# Patient Record
Sex: Male | Born: 1960 | Race: Black or African American | Hispanic: No | Marital: Single | Smoking: Never smoker
Health system: Southern US, Community
[De-identification: ages and names within clinical notes are randomized; demographics above are authoritative.]

## PROBLEM LIST (undated history)

## (undated) HISTORY — PX: KNEE ARTHROSCOPY: SUR90

---

## 2003-01-28 ENCOUNTER — Ambulatory Visit (HOSPITAL_COMMUNITY): Admission: RE | Admit: 2003-01-28 | Discharge: 2003-01-28 | Payer: Self-pay | Admitting: Family Medicine

## 2005-07-03 ENCOUNTER — Encounter: Admission: RE | Admit: 2005-07-03 | Discharge: 2005-07-03 | Payer: Self-pay | Admitting: Family Medicine

## 2010-10-06 ENCOUNTER — Encounter
Admission: RE | Admit: 2010-10-06 | Discharge: 2010-10-06 | Payer: Self-pay | Source: Home / Self Care | Attending: Internal Medicine | Admitting: Internal Medicine

## 2015-05-12 ENCOUNTER — Other Ambulatory Visit (HOSPITAL_COMMUNITY): Payer: Self-pay

## 2015-05-13 ENCOUNTER — Other Ambulatory Visit: Payer: Self-pay

## 2015-05-13 ENCOUNTER — Other Ambulatory Visit (HOSPITAL_COMMUNITY): Payer: Self-pay | Admitting: Internal Medicine

## 2015-05-13 ENCOUNTER — Ambulatory Visit (HOSPITAL_COMMUNITY): Payer: 59 | Attending: Internal Medicine

## 2015-05-13 DIAGNOSIS — I10 Essential (primary) hypertension: Secondary | ICD-10-CM | POA: Diagnosis not present

## 2015-05-13 DIAGNOSIS — I313 Pericardial effusion (noninflammatory): Secondary | ICD-10-CM | POA: Diagnosis not present

## 2015-11-12 MED FILL — AMLODIPINE BESYLATE 10 MG T: 10 | 90 days supply | Qty: 90 | Fill #0

## 2015-12-20 ENCOUNTER — Ambulatory Visit (INDEPENDENT_AMBULATORY_CARE_PROVIDER_SITE_OTHER): Payer: 59 | Admitting: Podiatry

## 2015-12-20 ENCOUNTER — Ambulatory Visit (INDEPENDENT_AMBULATORY_CARE_PROVIDER_SITE_OTHER): Payer: 59

## 2015-12-20 ENCOUNTER — Ambulatory Visit: Payer: Self-pay

## 2015-12-20 VITALS — BP 159/99 | HR 74 | Resp 16

## 2015-12-20 DIAGNOSIS — M205X9 Other deformities of toe(s) (acquired), unspecified foot: Secondary | ICD-10-CM

## 2015-12-20 DIAGNOSIS — M79671 Pain in right foot: Secondary | ICD-10-CM

## 2015-12-20 DIAGNOSIS — M722 Plantar fascial fibromatosis: Secondary | ICD-10-CM | POA: Diagnosis not present

## 2015-12-20 MED ORDER — DICLOFENAC SODIUM 75 MG PO TBEC: 75.0000 mg | DELAYED_RELEASE_TABLET | Freq: Two times a day (BID) | ORAL | Status: AC

## 2015-12-20 MED ORDER — TRIAMCINOLONE ACETONIDE 10 MG/ML IJ SUSP
10.0000 mg | Freq: Once | INTRAMUSCULAR | Status: AC
  Administered 2015-12-20: 10 mg

## 2015-12-20 MED FILL — DICLOFENAC SOD EC 75 MG TAB: 75 | 25 days supply | Qty: 50 | Fill #0

## 2015-12-20 NOTE — Progress Notes (Signed)
Subjective:     Patient ID: Jeremy Estrada, male   DOB: [birth year], 55 y.o.   MRN: [id number]  HPI patient states that he's had a lot of pain in his right heel for the last 6-8 months. Also does have lack of motion of his big toe joint left over right   Review of Systems  All other systems reviewed and are negative.      Objective:   Physical Exam  Constitutional: He is oriented to person, place, and time.  Cardiovascular: Intact distal pulses.   Musculoskeletal: Normal range of motion.  Neurological: He is oriented to person, place, and time.  Skin: Skin is warm.  Nursing note and vitals reviewed.  neurovascular status found to be intact with muscle strength adequate range of motion of the subtalar midtarsal joint within normal limits. Patient's found to have inflammatory changes in the plantar right heel with fluid buildup around the medial calcaneal fascial insertion with moderate depression of the arch noted bilateral. The left first MPJ has significant range of motion loss with mild on the right was structural deformity and bone spur formation and patient was noted to have good digital perfusion and is well oriented 3     Assessment:     Acute plantar fasciitis right with structural changes of the feet and also hallux limitus deformity left over right    Plan:     H&P and both conditions reviewed and x-rays reviewed with patient. I'm focusing on the right heel and I injected the plantar fascia 3 mg Kenalog 5 mg Xylocaine and applied fascial brace with instructions. Placed on diclofenac 75 mg twice a day and instructed on physical therapy and discussed hallux limitus with patient  Reports indicated spur formation plantar right heel and significant narrowing and range of motion loss was spur formation left over right first MPJ

## 2015-12-20 NOTE — Progress Notes (Signed)
   Subjective:    Patient ID: Jeremy Estrada, male    DOB: 16-Mar-1961, 55 y.o.   MRN: [id number]  HPI  Pt presents with right heel pain lasting several months now, he c/o burning, sharp shooting sensation, worse after prolonged standing  Review of Systems  All other systems reviewed and are negative.      Objective:   Physical Exam        Assessment & Plan:

## 2015-12-20 NOTE — Patient Instructions (Signed)

## 2015-12-31 MED FILL — LOSARTAN-HCTZ 100-25 MG TAB: 100-25 | 90 days supply | Qty: 90 | Fill #2

## 2016-01-03 ENCOUNTER — Ambulatory Visit: Payer: 59 | Admitting: Podiatry

## 2016-01-06 ENCOUNTER — Ambulatory Visit: Payer: 59 | Admitting: Podiatry

## 2016-02-22 MED FILL — AMLODIPINE BESYLATE 10 MG T: 10 | 30 days supply | Qty: 30 | Fill #0

## 2016-03-06 MED FILL — DICLOFENAC SOD EC 75 MG TAB: 75 | 25 days supply | Qty: 50 | Fill #1

## 2016-03-29 ENCOUNTER — Encounter: Payer: Self-pay | Admitting: Podiatry

## 2016-03-29 ENCOUNTER — Ambulatory Visit (INDEPENDENT_AMBULATORY_CARE_PROVIDER_SITE_OTHER): Payer: 59 | Admitting: Podiatry

## 2016-03-29 DIAGNOSIS — M722 Plantar fascial fibromatosis: Secondary | ICD-10-CM | POA: Diagnosis not present

## 2016-03-29 MED ORDER — TRIAMCINOLONE ACETONIDE 10 MG/ML IJ SUSP
10.0000 mg | Freq: Once | INTRAMUSCULAR | Status: AC
  Administered 2016-03-29: 10 mg

## 2016-03-29 MED FILL — AMLODIPINE BESYLATE 10 MG T: 10 | 30 days supply | Qty: 30 | Fill #1

## 2016-03-29 MED FILL — LOSARTAN-HCTZ 100-25 MG TAB: 100-25 | 90 days supply | Qty: 90 | Fill #3

## 2016-03-30 NOTE — Progress Notes (Signed)
Subjective:     Patient ID: Jeremy Estrada, male   DOB: 1960/11/09, 55 y.o.   MRN: [id number]  HPI patient states I'm starting to get quite a bit of pain in my heel again and it did improve for a little period of time but now is sore   Review of Systems     Objective:   Physical Exam Neurovascular status intact muscle strength adequate with exquisite discomfort plantar aspect right heel at the insertional point tendon into the calcaneus at this time    Assessment:     Plantar fasciitis right with inflammation and fluid around the medial band still noted    Plan:     Advised on anti-inflammatories physical therapy and I reinjected the plantar fascia 3 mg Kenalog 5 mg Xylocaine. I then went ahead and scanned for custom orthotics to reduce stress on the heel

## 2016-04-28 MED FILL — AMLODIPINE BESYLATE 10 MG T: 10 | 90 days supply | Qty: 90 | Fill #0

## 2016-05-31 MED FILL — DICLOFENAC SOD EC 75 MG TAB: 75 | 25 days supply | Qty: 50 | Fill #2

## 2016-06-21 ENCOUNTER — Ambulatory Visit: Payer: 59 | Admitting: *Deleted

## 2016-06-21 DIAGNOSIS — M722 Plantar fascial fibromatosis: Secondary | ICD-10-CM

## 2016-06-21 NOTE — Progress Notes (Signed)
Patient ID: Jeremy Estrada, male   DOB: 23-Jan-1961, 55 y.o.   MRN: [id number]  Patient presents for orthotic pick up.  Verbal and written break in and wear instructions given.  Patient will follow up in 4 weeks if symptoms worsen or fail to improve.

## 2016-06-21 NOTE — Patient Instructions (Signed)

## 2016-07-27 ENCOUNTER — Other Ambulatory Visit: Payer: Self-pay | Admitting: Podiatry

## 2016-07-27 MED FILL — AMLODIPINE BESYLATE 10 MG T: 10 | 90 days supply | Qty: 90 | Fill #1

## 2016-07-28 MED FILL — LOSARTAN-HCTZ 100-25 MG TAB: 100-25 | 90 days supply | Qty: 90 | Fill #0

## 2016-08-09 DIAGNOSIS — Z6832 Body mass index (BMI) 32.0-32.9, adult: Secondary | ICD-10-CM | POA: Diagnosis not present

## 2016-08-09 DIAGNOSIS — Z0000 Encounter for general adult medical examination without abnormal findings: Secondary | ICD-10-CM | POA: Diagnosis not present

## 2016-08-09 DIAGNOSIS — I10 Essential (primary) hypertension: Secondary | ICD-10-CM | POA: Diagnosis not present

## 2016-08-09 DIAGNOSIS — E663 Overweight: Secondary | ICD-10-CM | POA: Diagnosis not present

## 2016-08-09 DIAGNOSIS — Z125 Encounter for screening for malignant neoplasm of prostate: Secondary | ICD-10-CM | POA: Diagnosis not present

## 2016-09-29 DIAGNOSIS — R350 Frequency of micturition: Secondary | ICD-10-CM | POA: Diagnosis not present

## 2016-10-02 ENCOUNTER — Ambulatory Visit
Admission: RE | Admit: 2016-10-02 | Discharge: 2016-10-02 | Disposition: A | Discharge status: Home / Self Care | Payer: 59 | Source: Ambulatory Visit | Attending: Internal Medicine | Admitting: Internal Medicine

## 2016-10-02 ENCOUNTER — Other Ambulatory Visit: Payer: Self-pay | Admitting: Internal Medicine

## 2016-10-02 DIAGNOSIS — R109 Unspecified abdominal pain: Secondary | ICD-10-CM

## 2016-10-02 DIAGNOSIS — R319 Hematuria, unspecified: Secondary | ICD-10-CM | POA: Diagnosis not present

## 2016-10-05 MED FILL — TAMSULOSIN HCL 0.4 MG CAP: 0.4 | 90 days supply | Qty: 90 | Fill #0

## 2016-10-26 ENCOUNTER — Other Ambulatory Visit: Payer: Self-pay | Admitting: Gastroenterology

## 2016-11-03 ENCOUNTER — Other Ambulatory Visit: Payer: Self-pay | Admitting: Gastroenterology

## 2016-11-20 MED FILL — LOSARTAN-HCTZ 100-25 MG TAB: 100-25 | 90 days supply | Qty: 90 | Fill #0

## 2016-11-20 MED FILL — AMLODIPINE BESYLATE 10 MG T: 10 | 90 days supply | Qty: 90 | Fill #2

## 2016-11-22 MED FILL — GAVILYTE-N SOLUTION: 420 | 1 days supply | Qty: 4000 | Fill #0

## 2016-12-19 ENCOUNTER — Ambulatory Visit (HOSPITAL_COMMUNITY): Payer: 59 | Admitting: Anesthesiology

## 2016-12-19 ENCOUNTER — Encounter (HOSPITAL_COMMUNITY)
Admission: RE | Disposition: A | Discharge status: Home / Self Care | Payer: Self-pay | Source: Ambulatory Visit | Attending: Gastroenterology

## 2016-12-19 ENCOUNTER — Encounter (HOSPITAL_COMMUNITY): Payer: Self-pay | Admitting: Anesthesiology

## 2016-12-19 ENCOUNTER — Ambulatory Visit (HOSPITAL_COMMUNITY)
Admission: RE | Admit: 2016-12-19 | Discharge: 2016-12-19 | Disposition: A | Discharge status: Home / Self Care | Payer: 59 | Source: Ambulatory Visit | Attending: Gastroenterology | Admitting: Gastroenterology

## 2016-12-19 DIAGNOSIS — K621 Rectal polyp: Secondary | ICD-10-CM | POA: Diagnosis not present

## 2016-12-19 DIAGNOSIS — I10 Essential (primary) hypertension: Secondary | ICD-10-CM | POA: Insufficient documentation

## 2016-12-19 DIAGNOSIS — Z1211 Encounter for screening for malignant neoplasm of colon: Secondary | ICD-10-CM | POA: Diagnosis not present

## 2016-12-19 DIAGNOSIS — D122 Benign neoplasm of ascending colon: Secondary | ICD-10-CM | POA: Insufficient documentation

## 2016-12-19 DIAGNOSIS — D128 Benign neoplasm of rectum: Secondary | ICD-10-CM | POA: Insufficient documentation

## 2016-12-19 HISTORY — PX: COLONOSCOPY WITH PROPOFOL: SHX5780

## 2016-12-19 SURGERY — COLONOSCOPY WITH PROPOFOL
Anesthesia: Monitor Anesthesia Care

## 2016-12-19 MED ORDER — PROPOFOL 10 MG/ML IV BOLUS
INTRAVENOUS | Status: AC
  Filled 2016-12-19: qty 40

## 2016-12-19 MED ORDER — PROPOFOL 500 MG/50ML IV EMUL
INTRAVENOUS | Status: DC | PRN
  Administered 2016-12-19: 125 ug/kg/min via INTRAVENOUS

## 2016-12-19 MED ORDER — SODIUM CHLORIDE 0.9 % IV SOLN: INTRAVENOUS | Status: DC

## 2016-12-19 MED ORDER — PROPOFOL 500 MG/50ML IV EMUL
INTRAVENOUS | Status: DC | PRN
  Administered 2016-12-19: 50 mg via INTRAVENOUS
  Administered 2016-12-19: 30 mg via INTRAVENOUS
  Administered 2016-12-19: 20 mg via INTRAVENOUS

## 2016-12-19 MED ORDER — LACTATED RINGERS IV SOLN: INTRAVENOUS | Status: DC

## 2016-12-19 MED ORDER — LACTATED RINGERS IV SOLN
INTRAVENOUS | Status: DC | PRN
  Administered 2016-12-19: 11:00:00 via INTRAVENOUS

## 2016-12-19 MED ORDER — MIDAZOLAM HCL 2 MG/2ML IJ SOLN
INTRAMUSCULAR | Status: AC
  Filled 2016-12-19: qty 2

## 2016-12-19 MED ORDER — MIDAZOLAM HCL 5 MG/5ML IJ SOLN
INTRAMUSCULAR | Status: DC | PRN
  Administered 2016-12-19: 2 mg via INTRAVENOUS

## 2016-12-19 SURGICAL SUPPLY — 21 items

## 2016-12-19 NOTE — Transfer of Care (Signed)
Immediate Anesthesia Transfer of Care Note  Patient: Jeremy Estrada  Procedure(s) Performed: Procedure(s): COLONOSCOPY WITH PROPOFOL (N/A)  Patient Location: PACU  Anesthesia Type:MAC  Level of Consciousness:  sedated, patient cooperative and responds to stimulation  Airway & Oxygen Therapy:Patient Spontanous Breathing and Patient connected to face mask oxgen  Post-op Assessment:  Report given to PACU RN and Post -op Vital signs reviewed and stable  Post vital signs:  Reviewed and stable  Last Vitals:  Vitals:   12/19/16 1138 12/19/16 1222  BP: (!) 151/90 123/66  Pulse: 83 74  Resp: (!) 21 14  Temp: 36.7 C 95.2 C    Complications: No apparent anesthesia complications

## 2016-12-19 NOTE — Anesthesia Postprocedure Evaluation (Signed)
Anesthesia Post Note  Patient: Jeremy Estrada  Procedure(s) Performed: Procedure(s) (LRB): COLONOSCOPY WITH PROPOFOL (N/A)  Patient location during evaluation: PACU Anesthesia Type: MAC Level of consciousness: awake and alert Pain management: pain level controlled Vital Signs Assessment: post-procedure vital signs reviewed and stable Respiratory status: spontaneous breathing Cardiovascular status: stable Anesthetic complications: no       Last Vitals:  Vitals:   12/19/16 1240 12/19/16 1248  BP: (!) 134/91   Pulse: 67 63  Resp: 18 17  Temp:      Last Pain:  Vitals:   12/19/16 1222  TempSrc: Oral                 Nolon Nations

## 2016-12-19 NOTE — Anesthesia Preprocedure Evaluation (Signed)
Anesthesia Evaluation  Patient identified by MRN, date of birth, ID band Patient awake    Reviewed: Allergy & Precautions, NPO status , Patient's Chart, lab work & pertinent test results  Airway Mallampati: II  TM Distance: >3 FB Neck ROM: Full    Dental no notable dental hx.    Pulmonary neg pulmonary ROS,    Pulmonary exam normal breath sounds clear to auscultation       Cardiovascular negative cardio ROS Normal cardiovascular exam Rhythm:Regular Rate:Normal     Neuro/Psych negative neurological ROS  negative psych ROS   GI/Hepatic negative GI ROS, Neg liver ROS,   Endo/Other  negative endocrine ROS  Renal/GU negative Renal ROS     Musculoskeletal negative musculoskeletal ROS (+)   Abdominal   Peds  Hematology negative hematology ROS (+)   Anesthesia Other Findings   Reproductive/Obstetrics negative OB ROS                             Anesthesia Physical Anesthesia Plan  ASA: II  Anesthesia Plan: MAC   Post-op Pain Management:    Induction: Intravenous  Airway Management Planned:   Additional Equipment:   Intra-op Plan:   Post-operative Plan:   Informed Consent: I have reviewed the patients History and Physical, chart, labs and discussed the procedure including the risks, benefits and alternatives for the proposed anesthesia with the patient or authorized representative who has indicated his/her understanding and acceptance.   Dental advisory given  Plan Discussed with: CRNA  Anesthesia Plan Comments:         Anesthesia Quick Evaluation  

## 2016-12-19 NOTE — Op Note (Signed)
[facility] Patient Name: Jeremy Estrada Procedure Date: 12/19/2016 MRN: [id number] Attending MD: Garlan Fair , MD Date of Birth: 22-Oct-1961 CSN: [id number] Age: 56 Admit Type: Outpatient Procedure:                Colonoscopy Indications:              Screening for colorectal malignant neoplasm Providers:                Garlan Fair, MD, Dustin Flock RN, RN, Cherylynn Ridges, Technician, Arnoldo Hooker, CRNA Referring MD:              Medicines:                Propofol per Anesthesia Complications:            No immediate complications. Estimated Blood Loss:     Estimated blood loss was minimal. Procedure:                Pre-Anesthesia Assessment:                           - Prior to the procedure, a History and Physical                            was performed, and patient medications and                            allergies were reviewed. The patient's tolerance of                            previous anesthesia was also reviewed. The risks                            and benefits of the procedure and the sedation                            options and risks were discussed with the patient.                            All questions were answered, and informed consent                            was obtained. Prior Anticoagulants: The patient has                            taken aspirin, last dose was 5 days prior to                            procedure. ASA Grade Assessment: II - A patient                            with mild systemic disease. After reviewing the  risks and benefits, the patient was deemed in                            satisfactory condition to undergo the procedure.                           After obtaining informed consent, the colonoscope                            was passed under direct vision. Throughout the                            procedure, the patient's blood pressure, pulse, and                oxygen saturations were monitored continuously. The                            [id number] scope was introduced through                            the anus and advanced to the the cecum, identified                            by appendiceal orifice and ileocecal valve. The                            colonoscopy was performed without difficulty. The                            patient tolerated the procedure well. The quality                            of the bowel preparation was good. The appendiceal                            orifice and the rectum were photographed. Scope In: 11:58:29 AM Scope Out: 12:15:27 PM Scope Withdrawal Time: 0 hours 13 minutes 18 seconds  Total Procedure Duration: 0 hours 16 minutes 58 seconds  Findings:      The perianal and digital rectal examinations were normal.      The entire examined colon appeared normal except for the removal of a 2       mm sessile ascending colon polyp with the cold biopsy forceps and the       removal of a 2 mm sessile rectal polyp with the cold biopsy forceps. . Impression:               - The entire examined colon is normal except for                            the removal of two diminuitive colon polyps.. Moderate Sedation:      N/A- Per Anesthesia Care Recommendation:           - Patient has a contact number available for  emergencies. The signs and symptoms of potential                            delayed complications were discussed with the                            patient. Return to normal activities tomorrow.                            Written discharge instructions were provided to the                            patient.                           - Repeat colonoscopy date to be determined after                            pending pathology results are reviewed for                            screening purposes.                           - Resume previous diet.                            - Continue present medications. Procedure Code(s):        --- Professional ---                           [id number], Colorectal cancer screening; colonoscopy on                            individual not meeting criteria for high risk Diagnosis Code(s):        --- Professional ---                           Z12.11, Encounter for screening for malignant                            neoplasm of colon CPT copyright 2016 [employer]. All rights reserved. The codes documented in this report are preliminary and upon coder review may  be revised to meet current compliance requirements. Earle Gell, MD Garlan Fair, MD 12/19/2016 12:24:29 PM This report has been signed electronically. Number of Addenda: 0

## 2016-12-19 NOTE — Discharge Instructions (Signed)

## 2016-12-19 NOTE — H&P (Signed)
Procedure: Baseline screening colonoscopy  History: The patient is a 56 year old male born 08/16/1961. He is scheduled to undergo a screening colonoscopy today.  Medication allergies: All TACE cause cough  Past medical history: Left knee arthroscopic surgery. Hypertension.  Family history: No family history of colon cancer. Brother diagnosed with prostate cancer.  Exam: The patient is alert and lying comfortably on the endoscopy stretcher. Abdomen is soft and nontender to palpation. Lungs are clear to auscultation. Cardiac exam reveals a regular rhythm.  Plan: Proceed with screening colonoscopy

## 2016-12-20 ENCOUNTER — Encounter (HOSPITAL_COMMUNITY): Payer: Self-pay | Admitting: Gastroenterology

## 2017-01-31 MED FILL — TAMSULOSIN HCL 0.4 MG CAP: 0.4 | 90 days supply | Qty: 90 | Fill #1

## 2017-01-31 MED FILL — LOSARTAN-HCTZ 100-25 MG TAB: 100-25 | 90 days supply | Qty: 90 | Fill #1

## 2017-01-31 MED FILL — AMLODIPINE BESYLATE 10 MG T: 10 | 90 days supply | Qty: 90 | Fill #3

## 2017-03-05 IMAGING — CT CT ABD-PELV W/O CM
2 of 4 series · 13 of 36 positions shown, 18 images · non-contrast
Comparison: None.

CLINICAL DATA: Hematuria. Bilateral flank pain for 3 months.
Hypertension. Diabetes.

EXAM:
CT ABDOMEN AND PELVIS WITHOUT CONTRAST
TECHNIQUE: Multidetector CT imaging of the abdomen and pelvis was performed
following the standard protocol without IV contrast.

[Series 601: coronal body · coronal · 1.03mm/px · 1 of 138 slices shown, 2 images]
[im 46/138  soft-tissue]
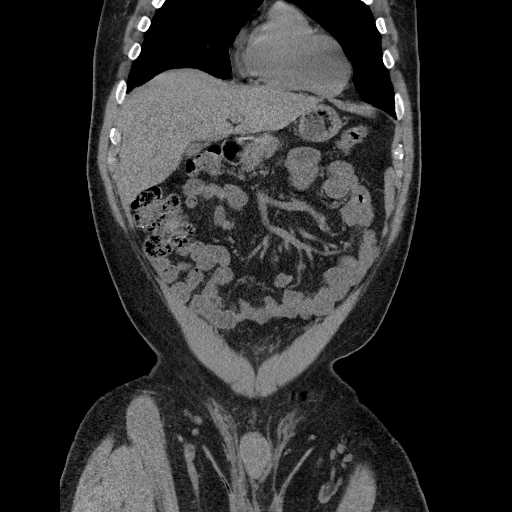
[im 46/138  bone]
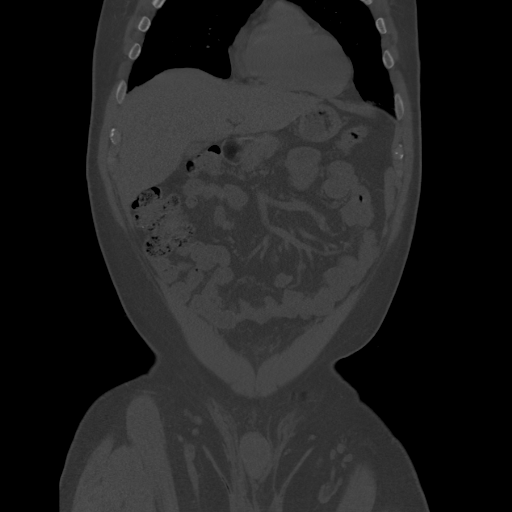

[Series 602: sagittal body · sagittal · 1.03mm/px · 12 of 171 slices shown, 16 images]
[im 11/171  soft-tissue]
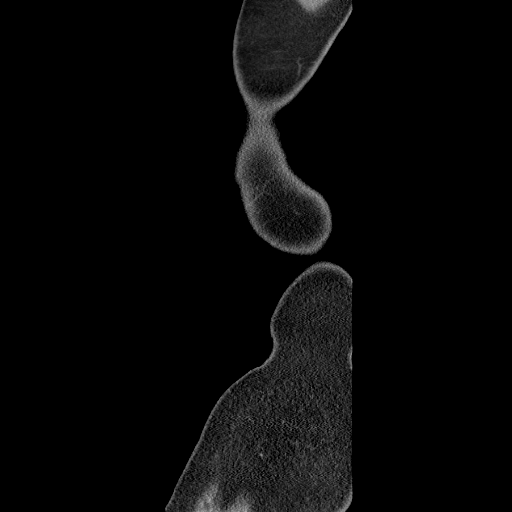
[im 11/171  lung]
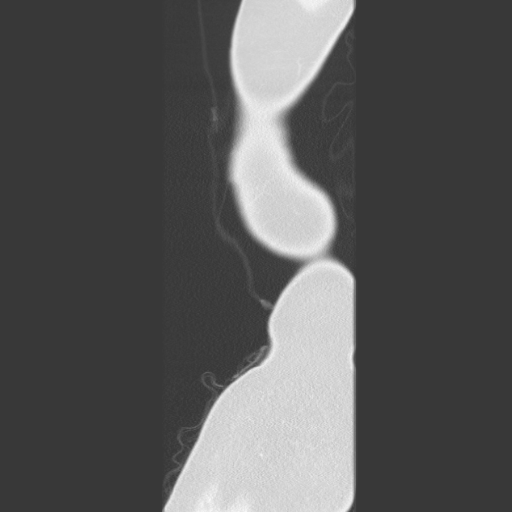
[im 11/171  bone]
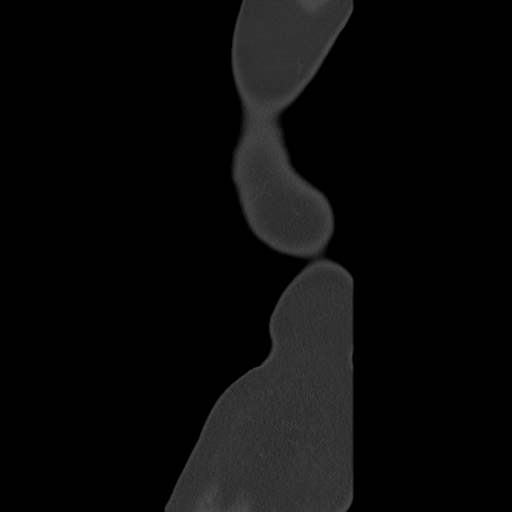
[im 21/171  lung]
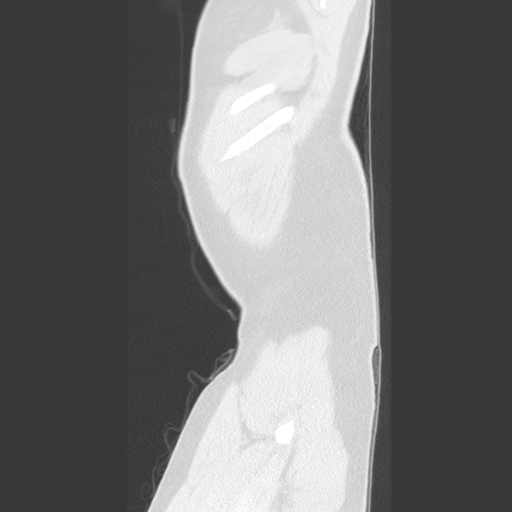
[im 31/171  soft-tissue]
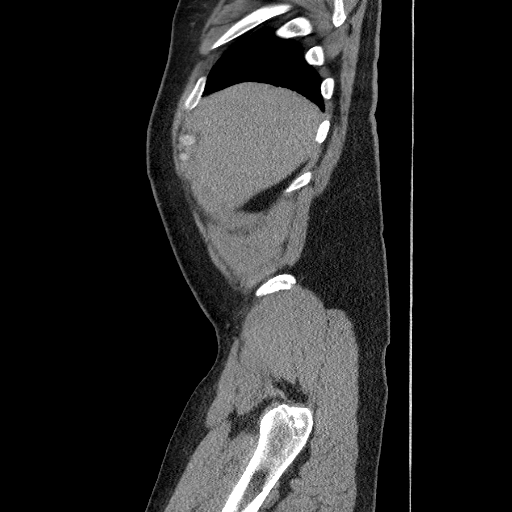
[im 31/171  lung]
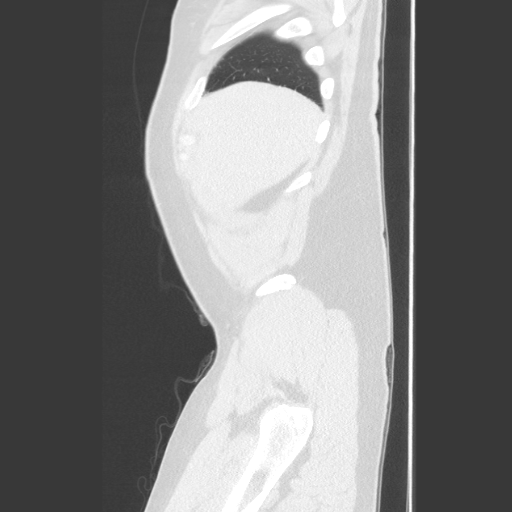
[im 41/171  lung]
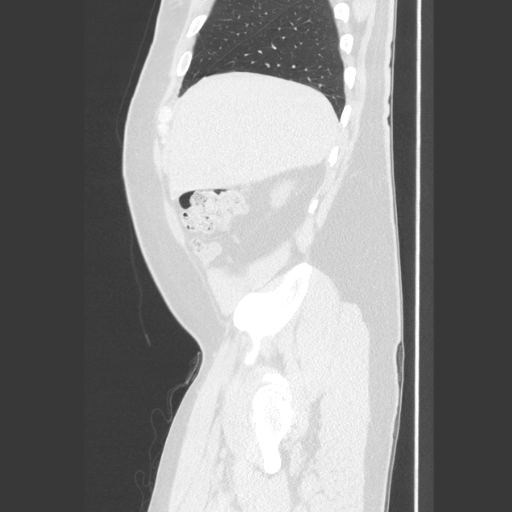
[im 51/171  soft-tissue]
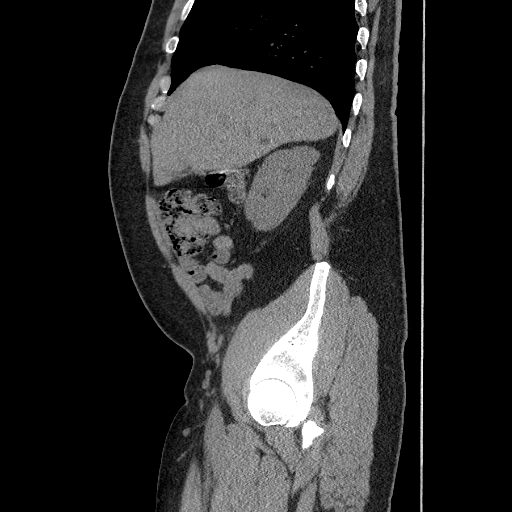
[im 61/171  soft-tissue]
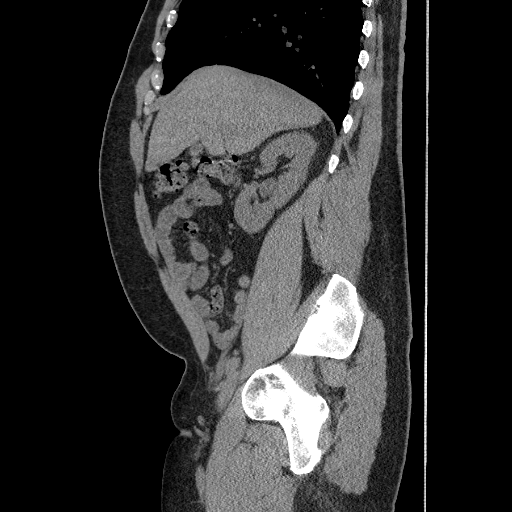
[im 81/171  soft-tissue]
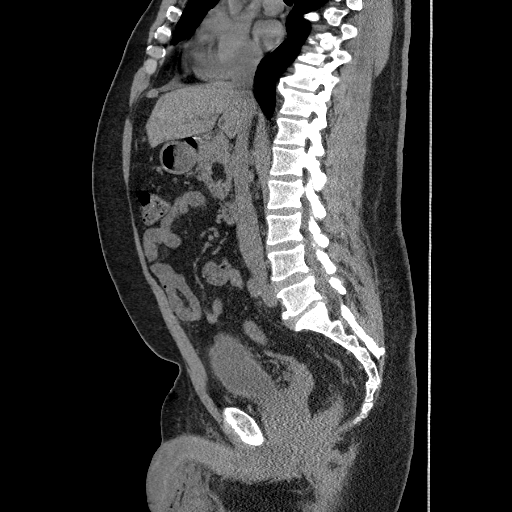
[im 91/171  soft-tissue]
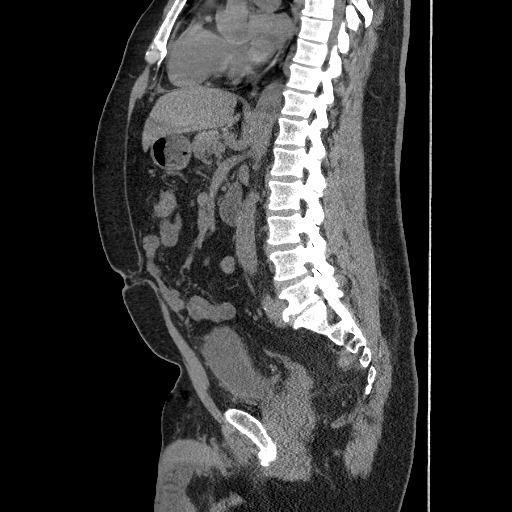
[im 111/171  soft-tissue]
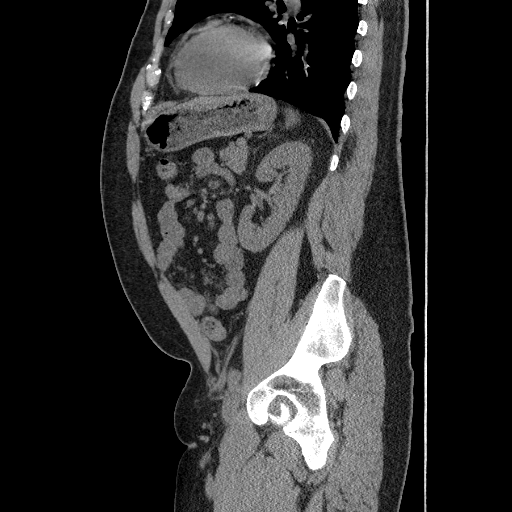
[im 131/171  soft-tissue]
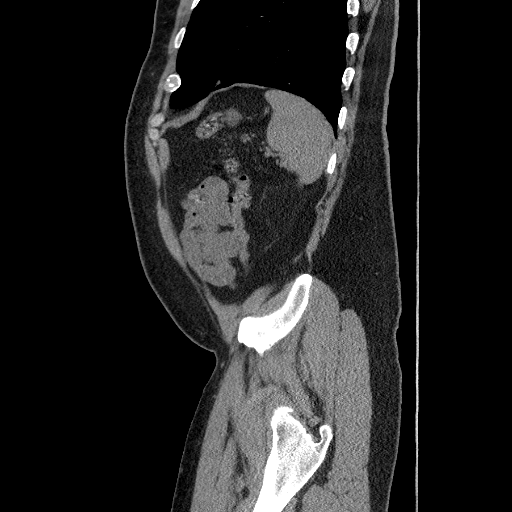
[im 141/171  soft-tissue]
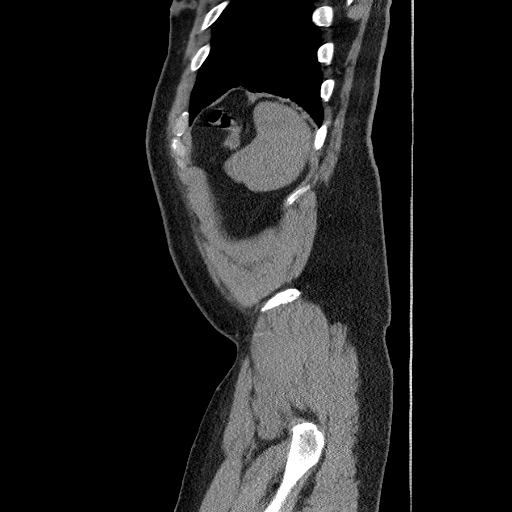
[im 141/171  bone]
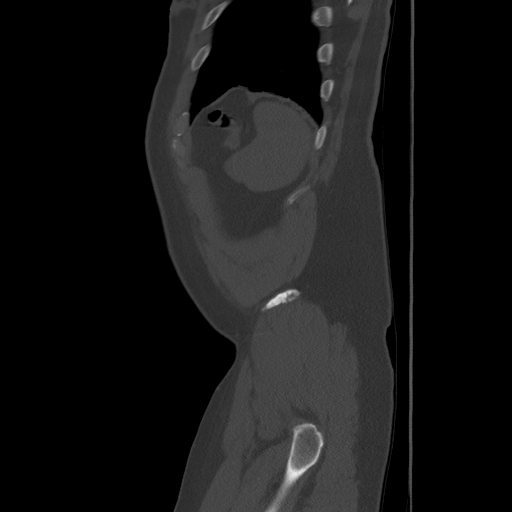
[im 161/171  soft-tissue]
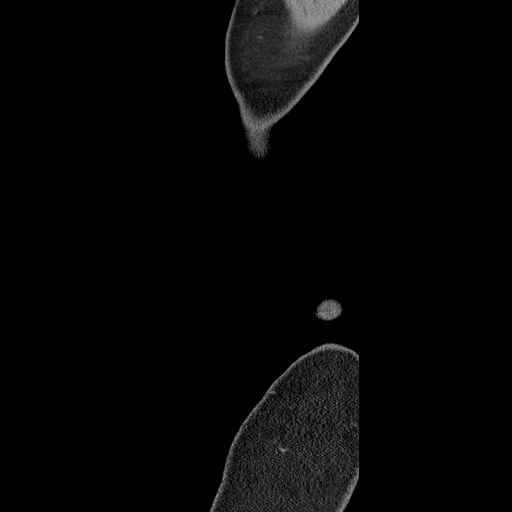

[13 of 36 positions shown; findings below may reference images not displayed]

FINDINGS: Lower chest: 5 mm lingular nodule on image 18/series 4. Mild
scarring or subsegmental atelectasis in the left lower lobe.

Normal heart size without pericardial or pleural effusion. Lad
coronary artery atherosclerosis on image 4/series 3. The

Hepatobiliary: Well-circumscribed hypoattenuating liver lesions are
likely cysts. The largest is in segment 4A and measures 2.2 cm.
Normal gallbladder, without biliary ductal dilatation.

Pancreas: Pancreatic head/ uncinate process lipoma versus atrophy
with interdigitation of peripancreatic fat. Example 2.2 cm on image
36/series 3.

Spleen: Normal in size, without focal abnormality.

Adrenals/Urinary Tract: Normal adrenal glands. No renal calculi or
hydronephrosis. No hydroureter or ureteric calculi. No bladder
calculi. Mild pericystic interstitial thickening suspected,
including on image 76/series 3.

Stomach/Bowel: Normal stomach, without wall thickening. Normal
colon, appendix, and terminal ileum. Normal small bowel.

Vascular/Lymphatic: Aortic and branch vessel atherosclerosis. No
abdominopelvic adenopathy.

Reproductive: Moderate prostatomegaly.

Other: No significant free fluid. Periumbilical fat containing
hernia or laxity on image 58/series 3.

Musculoskeletal: Left iliac sclerotic lesion is most likely a bone
island. Degenerate disc disease involves the lumbosacral junction.
IMPRESSION: 1.  No urinary tract calculi or hydronephrosis.
2. Mild pericystic interstitial thickening. Concurrent
prostatomegaly. Findings could relate to a component of bladder
outlet obstruction or cystitis.
3.  Coronary artery atherosclerosis. Aortic atherosclerosis.
4. 5 mm lingular nodule. No follow-up needed if patient is low-risk.
Non-contrast chest CT can be considered in 12 months if patient is
high-risk. This recommendation follows the consensus statement:
Guidelines for Management of Incidental Pulmonary Nodules Detected

## 2017-07-04 MED FILL — LOSARTAN-HCTZ 100-25 MG TAB: 100-25 | 90 days supply | Qty: 90 | Fill #2

## 2017-07-04 MED FILL — AMLODIPINE BESYLATE 10 MG T: 10 | 90 days supply | Qty: 90 | Fill #0

## 2017-07-04 MED FILL — TAMSULOSIN HCL 0.4 MG CAP: 0.4 | 90 days supply | Qty: 90 | Fill #2

## 2017-10-22 MED FILL — LOSARTAN-HCTZ 100-25 MG TAB: 100-25 | 90 days supply | Qty: 90 | Fill #0

## 2017-10-22 MED FILL — TAMSULOSIN HCL 0.4 MG CAP: 0.4 | 90 days supply | Qty: 90 | Fill #0

## 2017-10-22 MED FILL — AMLODIPINE BESYLATE 10 MG T: 10 | 90 days supply | Qty: 90 | Fill #0

## 2018-02-08 MED FILL — AMLODIPINE BESYLATE 10 MG T: 10 | 90 days supply | Qty: 90 | Fill #1

## 2018-02-08 MED FILL — LOSARTAN-HCTZ 100-25 MG TAB: 100-25 | 90 days supply | Qty: 90 | Fill #1

## 2018-05-31 MED FILL — AMLODIPINE BESYLATE 10 MG T: 10 | 90 days supply | Qty: 90 | Fill #2

## 2018-05-31 MED FILL — LOSARTAN-HCTZ 100-25 MG TAB: 100-25 | 90 days supply | Qty: 90 | Fill #2

## 2018-09-17 MED FILL — AMLODIPINE BESYLATE 10 MG T: 10 | 90 days supply | Qty: 90 | Fill #3

## 2018-09-24 MED FILL — LOSARTAN POTASSIUM 100 MG T: 100 | 90 days supply | Qty: 90 | Fill #0

## 2018-09-24 MED FILL — HYDROCHLOROTHIAZIDE 25 MG T: 25 | 90 days supply | Qty: 90 | Fill #0

## 2019-01-10 MED FILL — HYDROCHLOROTHIAZIDE 25 MG T: 25 | 90 days supply | Qty: 90 | Fill #1

## 2019-01-10 MED FILL — LOSARTAN POTASSIUM 100 MG T: 100 | 90 days supply | Qty: 90 | Fill #1

## 2019-01-15 MED FILL — MELOXICAM 15 MG TABLET: 15 | 30 days supply | Qty: 30 | Fill #0

## 2019-01-15 MED FILL — AMLODIPINE BESYLATE 10 MG T: 10 | 90 days supply | Qty: 90 | Fill #0

## 2019-03-29 ENCOUNTER — Other Ambulatory Visit: Payer: Self-pay

## 2019-03-29 DIAGNOSIS — Z20822 Contact with and (suspected) exposure to covid-19: Secondary | ICD-10-CM

## 2019-03-31 LAB — NOVEL CORONAVIRUS, NAA: SARS-CoV-2, NAA: NOT DETECTED

## 2019-04-08 ENCOUNTER — Ambulatory Visit: Payer: Self-pay

## 2019-04-08 NOTE — Telephone Encounter (Signed)
This encounter was created in error - please disregard.

## 2019-04-08 NOTE — Telephone Encounter (Addendum)
Patient called about the below symptoms he mentioned to the agent. He says that he was calling for his covid results and since they are negative, he just asked the question why he was still having those symptoms. I asked did he contact his PCP, he says no, because I was waiting on my covid results and now that they are negative, I just wonder why am I still having the symptoms. I advised him to call his PCP and discuss the symptoms, he verbalized understanding and says he will call.   Pt reports off and on chest tightness  Feels like he has had a cold for 2 months  2 nights ago reports that he felt "bubbling/movement" in his chest  Dry cough (BP medicine possible cause)  Slight headache  Symptoms

## 2019-04-17 MED FILL — AMLODIPINE BESYLATE 10 MG T: 10 | 90 days supply | Qty: 90 | Fill #0

## 2019-04-17 MED FILL — HYDROCHLOROTHIAZIDE 25 MG T: 25 | 90 days supply | Qty: 90 | Fill #2

## 2019-04-17 MED FILL — LOSARTAN POTASSIUM 100 MG T: 100 | 90 days supply | Qty: 90 | Fill #2

## 2019-08-04 MED FILL — LOSARTAN POTASSIUM 100 MG T: 100 | 90 days supply | Qty: 90 | Fill #3

## 2019-08-04 MED FILL — HYDROCHLOROTHIAZIDE 25 MG T: 25 | 90 days supply | Qty: 90 | Fill #3

## 2019-08-04 MED FILL — AMLODIPINE BESYLATE 10 MG T: 10 | 90 days supply | Qty: 90 | Fill #1

## 2019-11-07 MED FILL — HYDROCHLOROTHIAZIDE 25 MG T: 25 | 90 days supply | Qty: 90 | Fill #0

## 2019-11-07 MED FILL — LOSARTAN POTASSIUM 100 MG T: 100 | 90 days supply | Qty: 90 | Fill #0

## 2019-11-07 MED FILL — AMLODIPINE BESYLATE 10 MG T: 10 | 90 days supply | Qty: 90 | Fill #2

## 2020-01-05 ENCOUNTER — Ambulatory Visit: Payer: 59 | Attending: Internal Medicine

## 2020-01-05 DIAGNOSIS — Z23 Encounter for immunization: Secondary | ICD-10-CM

## 2020-01-05 NOTE — Progress Notes (Signed)
   Covid-19 Vaccination Clinic  Name:  Raahim Golas    MRN: [id number] DOB: 06-Feb-1961  01/05/2020  Mr. Dobin was observed post Covid-19 immunization for 15 minutes without incident. He was provided with Vaccine Information Sheet and instruction to access the V-Safe system.   Mr. Bruington was instructed to call 911 with any severe reactions post vaccine: Marland Kitchen Difficulty breathing  . Swelling of face and throat  . A fast heartbeat  . A bad rash all over body  . Dizziness and weakness   Immunizations Administered    Name Date Dose VIS Date Route   Pfizer COVID-19 Vaccine 01/05/2020  3:14 PM 0.3 mL 10/03/2019 Intramuscular   Manufacturer: [place]   Lot: [id number]   [place]: [id number]

## 2020-01-28 ENCOUNTER — Ambulatory Visit: Payer: 59 | Attending: Internal Medicine

## 2020-01-28 DIAGNOSIS — Z23 Encounter for immunization: Secondary | ICD-10-CM

## 2020-01-28 NOTE — Progress Notes (Signed)
   Covid-19 Vaccination Clinic  Name:  Jeremy Estrada    MRN: [id number] DOB: [birth year]  01/28/2020  Jeremy Estrada was observed post Covid-19 immunization for 15 minutes without incident. He was provided with Vaccine Information Sheet and instruction to access the V-Safe system.   Jeremy Estrada was instructed to call 911 with any severe reactions post vaccine: Marland Kitchen Difficulty breathing  . Swelling of face and throat  . A fast heartbeat  . A bad rash all over body  . Dizziness and weakness   Immunizations Administered    Name Date Dose VIS Date Route   Pfizer COVID-19 Vaccine 01/28/2020  1:37 PM 0.3 mL 10/03/2019 Intramuscular   Manufacturer: [place]   Lot: [id number]   [place]: [id number]

## 2020-02-10 MED FILL — LOSARTAN POTASSIUM 100 MG T: 100 | 90 days supply | Qty: 90 | Fill #1

## 2020-02-11 ENCOUNTER — Other Ambulatory Visit (HOSPITAL_COMMUNITY): Payer: Self-pay | Admitting: Internal Medicine

## 2020-02-11 MED FILL — AMLODIPINE BESYLATE 10 MG T: 10 | 90 days supply | Qty: 90 | Fill #0

## 2020-05-24 MED FILL — AMLODIPINE BESYLATE 10 MG T: 10 | 90 days supply | Qty: 90 | Fill #1

## 2020-05-24 MED FILL — LOSARTAN POTASSIUM 100 MG T: 100 | 90 days supply | Qty: 90 | Fill #2

## 2020-05-24 MED FILL — HYDROCHLOROTHIAZIDE 25 MG T: 25 | 90 days supply | Qty: 90 | Fill #1

## 2020-09-03 MED FILL — AMLODIPINE BESYLATE 10 MG T: 10 | 90 days supply | Qty: 90 | Fill #2

## 2020-09-03 MED FILL — LOSARTAN POTASSIUM 100 MG T: 100 | 90 days supply | Qty: 90 | Fill #3

## 2020-09-03 MED FILL — HYDROCHLOROTHIAZIDE 25 MG T: 25 | 90 days supply | Qty: 90 | Fill #2

## 2020-12-10 MED FILL — AMLODIPINE BESYLATE 10 MG T: 10 | 90 days supply | Qty: 90 | Fill #3

## 2020-12-15 ENCOUNTER — Other Ambulatory Visit (HOSPITAL_COMMUNITY): Payer: Self-pay | Admitting: Internal Medicine

## 2020-12-15 MED FILL — SILDENAFIL CITRATE 100 MG T: 100 | 30 days supply | Qty: 10 | Fill #0

## 2020-12-15 MED FILL — LOSARTAN POTASSIUM 100 MG T: 100 | 30 days supply | Qty: 30 | Fill #0

## 2020-12-15 MED FILL — HYDROCHLOROTHIAZIDE 25 MG T: 25 | 90 days supply | Qty: 90 | Fill #0

## 2020-12-29 ENCOUNTER — Ambulatory Visit
Admission: RE | Admit: 2020-12-29 | Discharge: 2020-12-29 | Disposition: A | Discharge status: Home / Self Care | Payer: 59 | Source: Ambulatory Visit | Attending: Internal Medicine | Admitting: Internal Medicine

## 2020-12-29 ENCOUNTER — Other Ambulatory Visit: Payer: Self-pay | Admitting: Internal Medicine

## 2020-12-29 DIAGNOSIS — R52 Pain, unspecified: Secondary | ICD-10-CM

## 2021-01-18 MED FILL — LOSARTAN POTASSIUM 100 MG T: 100 | 30 days supply | Qty: 30 | Fill #1

## 2021-02-25 ENCOUNTER — Other Ambulatory Visit (HOSPITAL_COMMUNITY): Payer: Self-pay

## 2021-02-25 MED FILL — Losartan Potassium Tab 100 MG: ORAL | 90 days supply | Qty: 90 | Fill #0 | Status: AC

## 2021-03-25 ENCOUNTER — Other Ambulatory Visit (HOSPITAL_COMMUNITY): Payer: Self-pay

## 2021-03-29 ENCOUNTER — Other Ambulatory Visit (HOSPITAL_COMMUNITY): Payer: Self-pay

## 2021-03-29 MED FILL — Amlodipine Besylate Tab 10 MG (Base Equivalent): ORAL | 90 days supply | Qty: 90 | Fill #0 | Status: AC

## 2021-03-30 ENCOUNTER — Other Ambulatory Visit (HOSPITAL_COMMUNITY): Payer: Self-pay

## 2021-06-01 IMAGING — CR DG SHOULDER 2+V*R*
3 series · 3 of 3 positions shown · non-contrast
Comparison: None.

CLINICAL DATA: Right-sided shoulder pain, no known injury, initial
encounter

EXAM:
RIGHT SHOULDER - 2+ VIEW

[w shoulder ap internal righ]
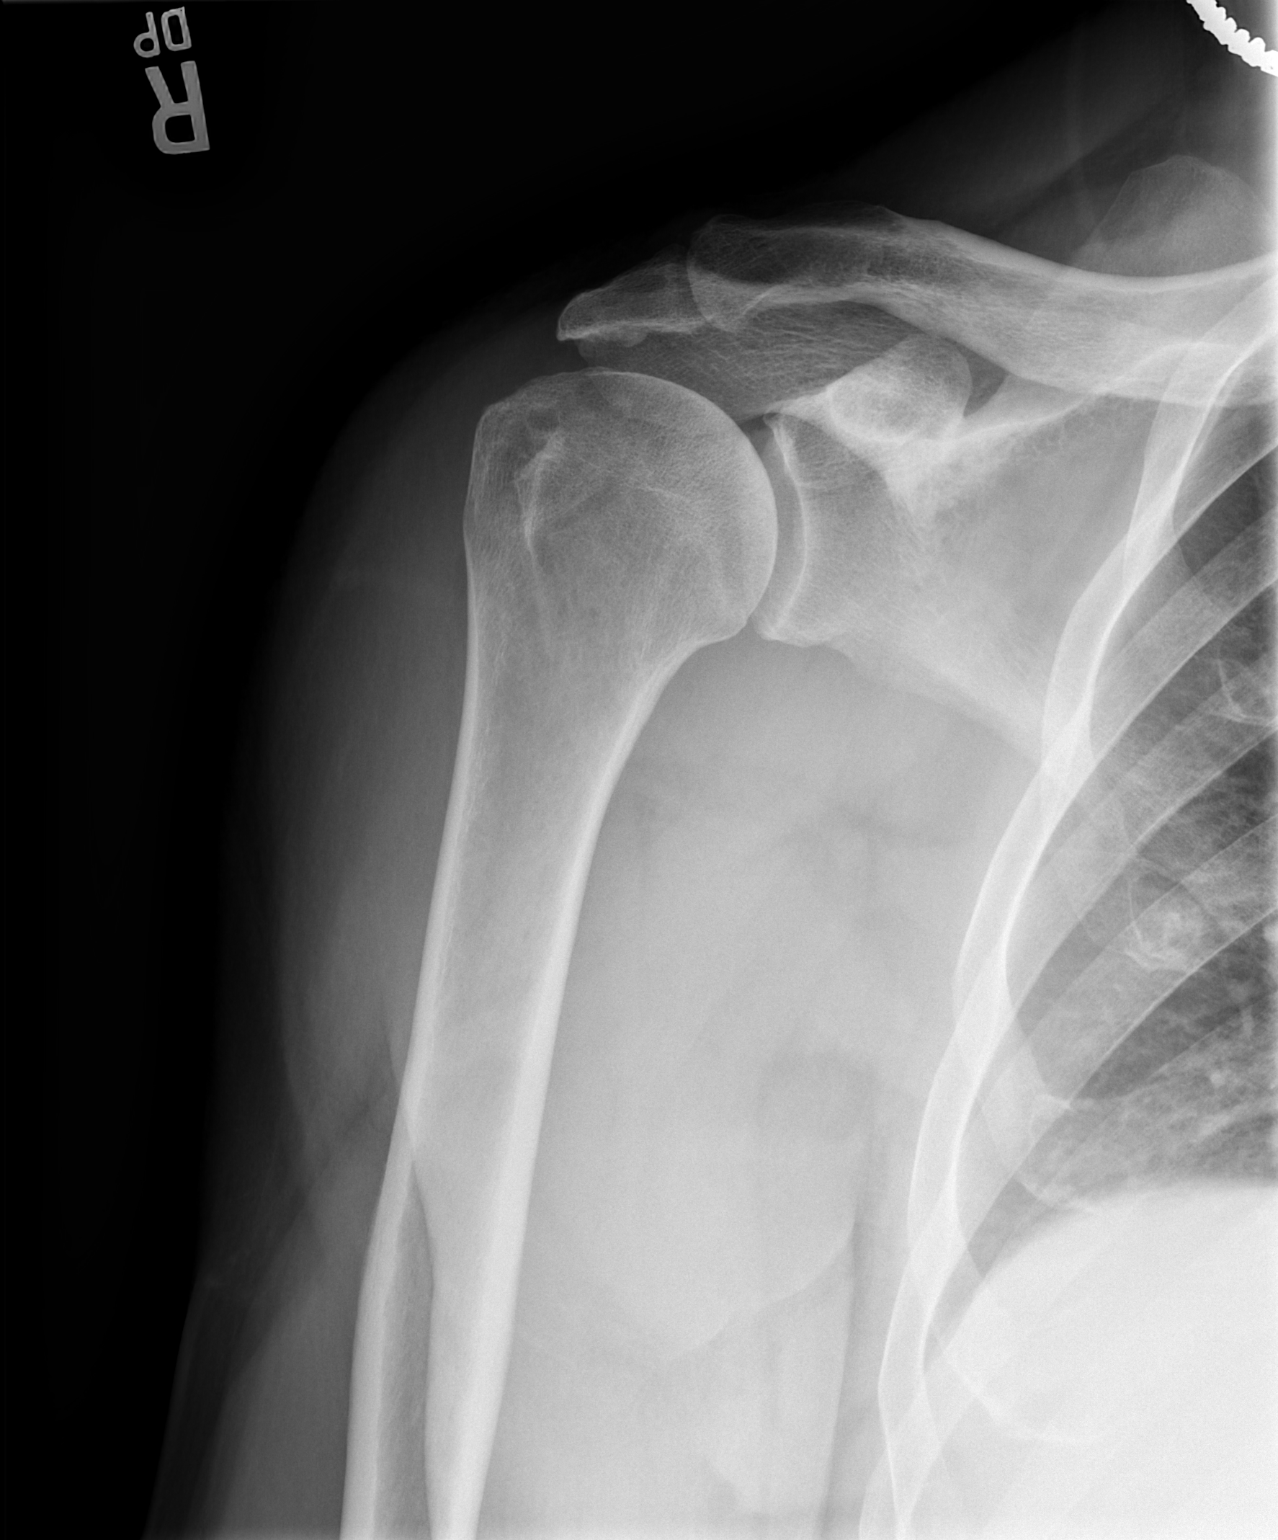

[w shoulder y view right]
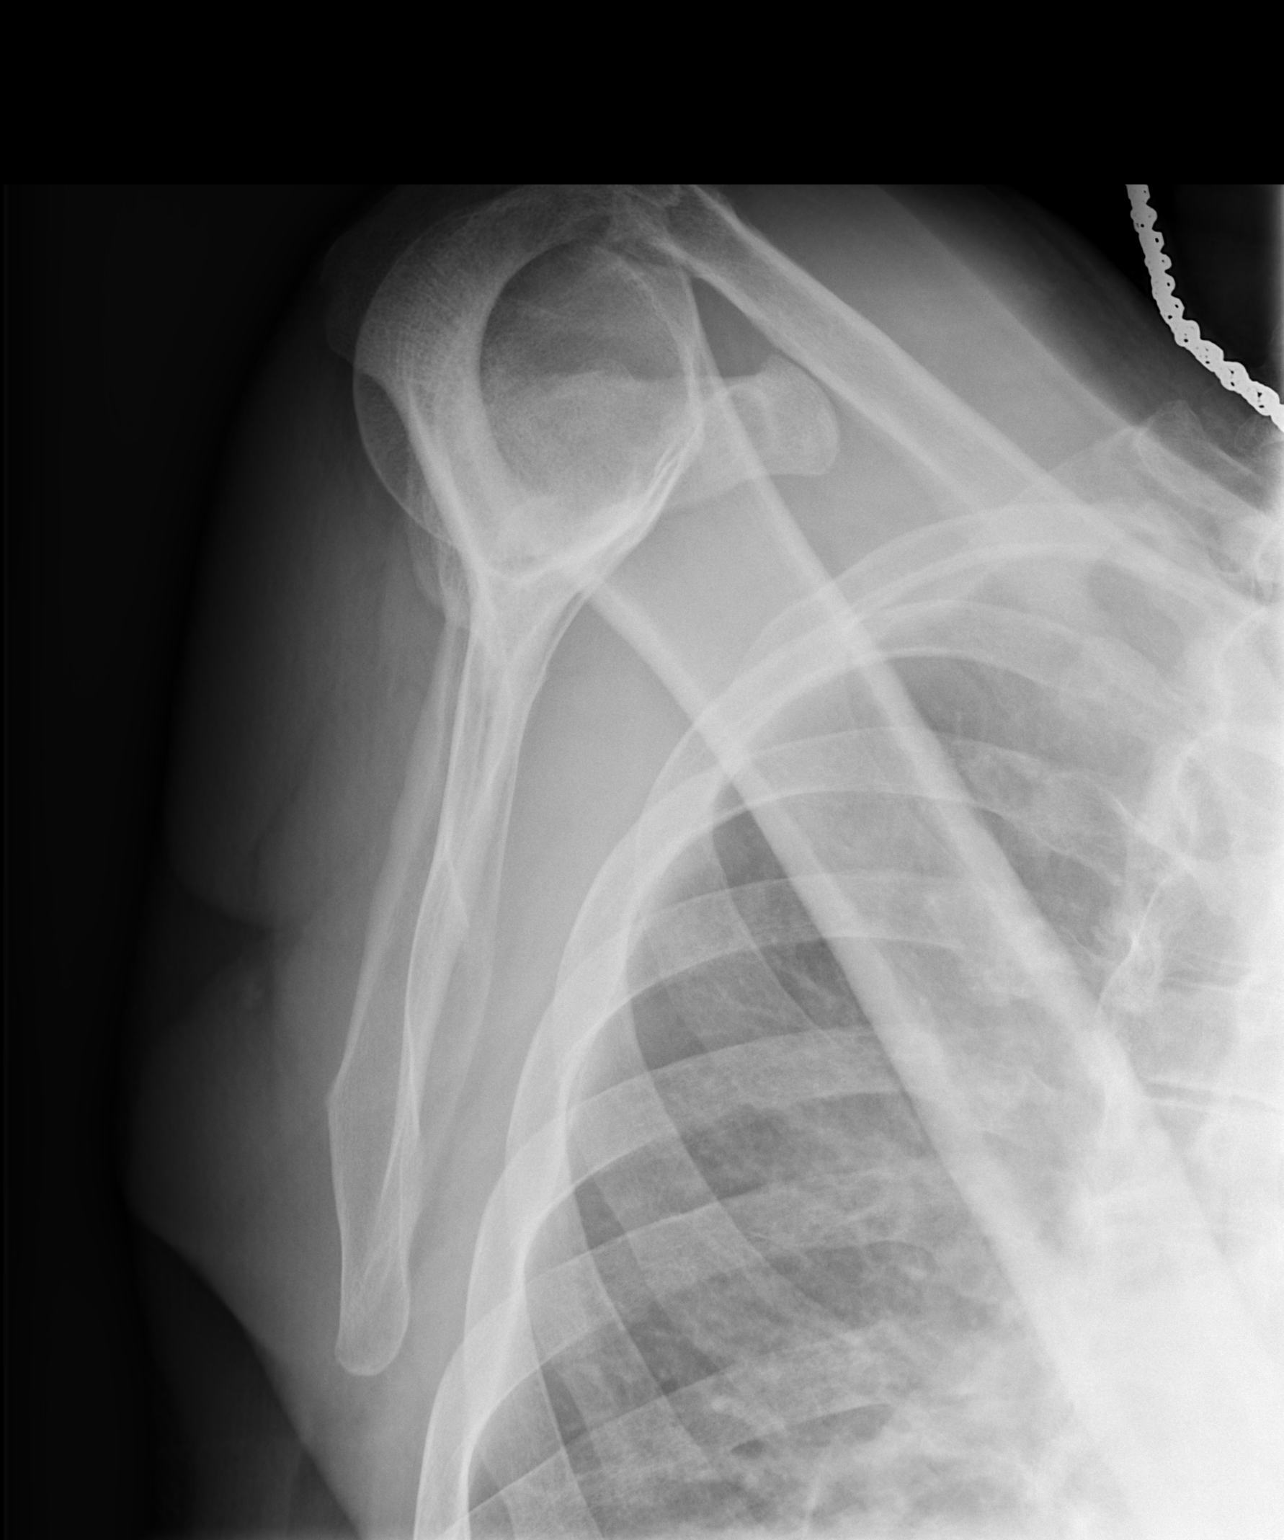

[w shoulder axillary right *]
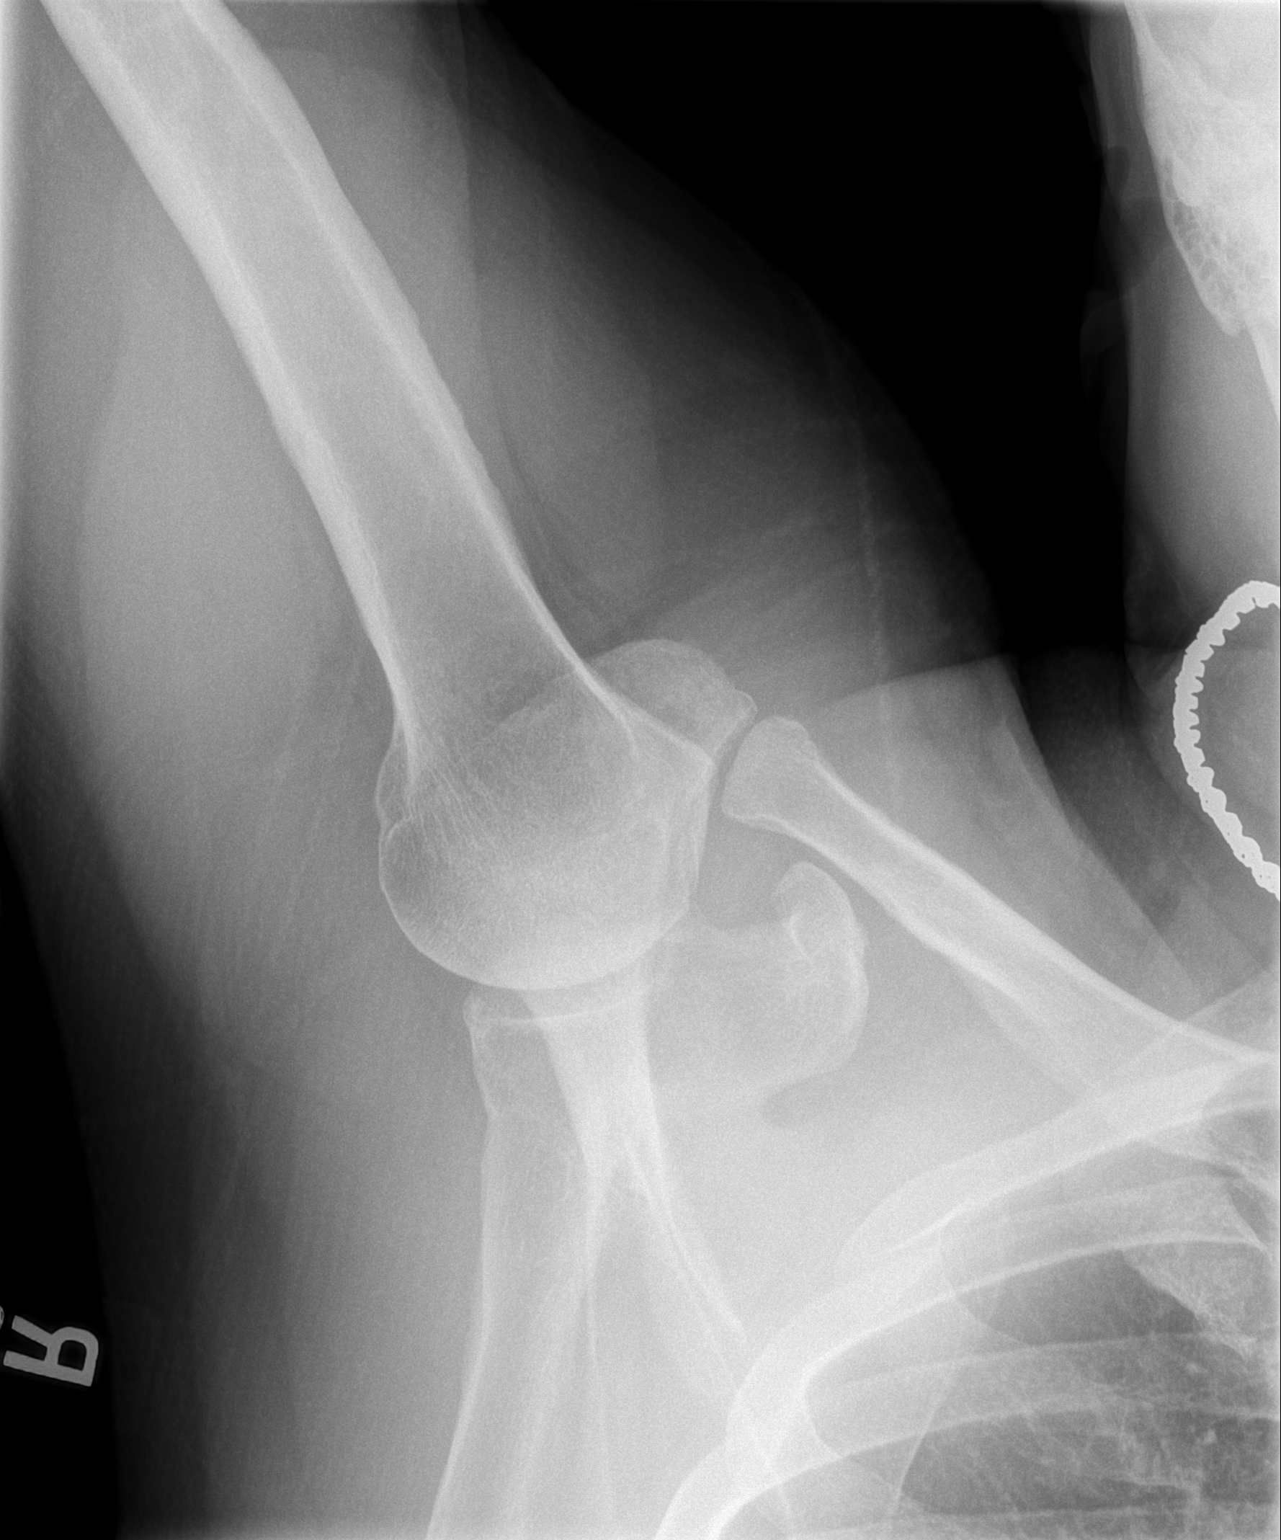

[3 of 3 positions shown; findings below may reference images not displayed]

FINDINGS: Degenerative changes of the acromioclavicular joint are noted. No
acute fracture or dislocation is noted. No soft tissue abnormality
is seen.
IMPRESSION: Degenerative change without acute abnormality.

## 2021-06-07 MED FILL — Amlodipine Besylate Tab 10 MG (Base Equivalent): ORAL | 90 days supply | Qty: 90 | Fill #1 | Status: AC

## 2021-06-07 MED FILL — Losartan Potassium Tab 100 MG: ORAL | 90 days supply | Qty: 90 | Fill #1 | Status: AC

## 2021-06-08 ENCOUNTER — Other Ambulatory Visit (HOSPITAL_COMMUNITY): Payer: Self-pay

## 2021-09-22 ENCOUNTER — Other Ambulatory Visit (HOSPITAL_COMMUNITY): Payer: Self-pay

## 2021-09-22 MED FILL — Losartan Potassium Tab 100 MG: ORAL | 90 days supply | Qty: 90 | Fill #2 | Status: AC

## 2021-09-22 MED FILL — Amlodipine Besylate Tab 10 MG (Base Equivalent): ORAL | 60 days supply | Qty: 60 | Fill #2 | Status: AC

## 2022-01-09 ENCOUNTER — Other Ambulatory Visit (HOSPITAL_COMMUNITY): Payer: Self-pay

## 2022-01-10 ENCOUNTER — Other Ambulatory Visit (HOSPITAL_COMMUNITY): Payer: Self-pay

## 2022-01-11 ENCOUNTER — Other Ambulatory Visit (HOSPITAL_COMMUNITY): Payer: Self-pay

## 2022-01-11 MED ORDER — LOSARTAN POTASSIUM 100 MG PO TABS
100.0000 mg | ORAL_TABLET | Freq: Every day | ORAL | 0 refills | Status: DC
  Filled 2022-01-11: qty 90, 90d supply, fill #0

## 2022-01-11 MED ORDER — AMLODIPINE BESYLATE 10 MG PO TABS
10.0000 mg | ORAL_TABLET | Freq: Every day | ORAL | 0 refills | Status: DC
  Filled 2022-01-11: qty 90, 90d supply, fill #0

## 2022-01-11 MED ORDER — HYDROCHLOROTHIAZIDE 25 MG PO TABS
25.0000 mg | ORAL_TABLET | Freq: Every morning | ORAL | 0 refills | Status: AC
  Filled 2022-01-11: qty 90, 90d supply, fill #0

## 2022-01-12 ENCOUNTER — Other Ambulatory Visit (HOSPITAL_COMMUNITY): Payer: Self-pay

## 2022-01-12 MED ORDER — METOPROLOL SUCCINATE ER 50 MG PO TB24
50.0000 mg | ORAL_TABLET | Freq: Every day | ORAL | 0 refills | Status: AC
  Filled 2022-01-12: qty 30, 30d supply, fill #0

## 2022-03-29 ENCOUNTER — Other Ambulatory Visit (HOSPITAL_COMMUNITY): Payer: Self-pay

## 2022-03-29 MED ORDER — HYDROCHLOROTHIAZIDE 25 MG PO TABS
25.0000 mg | ORAL_TABLET | Freq: Every morning | ORAL | 3 refills | Status: DC
  Filled 2022-03-29: qty 90, 90d supply, fill #0
  Filled 2022-06-30: qty 90, 90d supply, fill #1
  Filled 2022-10-06: qty 90, 90d supply, fill #2
  Filled 2023-01-12: qty 90, 90d supply, fill #3

## 2022-03-30 ENCOUNTER — Other Ambulatory Visit (HOSPITAL_COMMUNITY): Payer: Self-pay

## 2022-03-30 MED ORDER — LOSARTAN POTASSIUM 100 MG PO TABS
100.0000 mg | ORAL_TABLET | Freq: Every day | ORAL | 3 refills | Status: DC
  Filled 2022-03-30: qty 90, 90d supply, fill #0
  Filled 2022-06-30: qty 90, 90d supply, fill #1
  Filled 2022-10-06: qty 90, 90d supply, fill #2
  Filled 2023-01-12: qty 90, 90d supply, fill #3

## 2022-03-30 MED ORDER — AMLODIPINE BESYLATE 10 MG PO TABS
10.0000 mg | ORAL_TABLET | Freq: Every day | ORAL | 3 refills | Status: DC
  Filled 2022-03-30: qty 90, 90d supply, fill #0
  Filled 2022-06-30: qty 90, 90d supply, fill #1
  Filled 2022-10-06: qty 90, 90d supply, fill #2
  Filled 2023-01-12: qty 90, 90d supply, fill #3

## 2022-04-19 ENCOUNTER — Other Ambulatory Visit (HOSPITAL_COMMUNITY): Payer: Self-pay

## 2022-06-30 ENCOUNTER — Other Ambulatory Visit (HOSPITAL_COMMUNITY): Payer: Self-pay

## 2022-07-03 ENCOUNTER — Other Ambulatory Visit (HOSPITAL_COMMUNITY): Payer: Self-pay

## 2022-10-06 ENCOUNTER — Other Ambulatory Visit (HOSPITAL_COMMUNITY): Payer: Self-pay

## 2023-01-12 ENCOUNTER — Other Ambulatory Visit (HOSPITAL_COMMUNITY): Payer: Self-pay

## 2023-01-15 ENCOUNTER — Other Ambulatory Visit (HOSPITAL_COMMUNITY): Payer: Self-pay

## 2023-01-24 ENCOUNTER — Other Ambulatory Visit (HOSPITAL_COMMUNITY): Payer: Self-pay

## 2023-01-24 MED ORDER — SILDENAFIL CITRATE 100 MG PO TABS
100.0000 mg | ORAL_TABLET | Freq: Every day | ORAL | 6 refills | Status: AC
  Filled 2023-01-24: qty 10, 10d supply, fill #0

## 2023-01-24 MED ORDER — METFORMIN HCL 1000 MG PO TABS
ORAL_TABLET | ORAL | 0 refills | Status: AC
  Filled 2023-01-24: qty 60, 30d supply, fill #0

## 2023-03-26 ENCOUNTER — Other Ambulatory Visit (HOSPITAL_COMMUNITY): Payer: Self-pay

## 2023-03-26 MED ORDER — METFORMIN HCL ER 500 MG PO TB24
ORAL_TABLET | ORAL | 1 refills | Status: AC
  Filled 2023-03-26: qty 30, 18d supply, fill #0
  Filled 2023-04-12: qty 30, 15d supply, fill #1

## 2023-04-12 ENCOUNTER — Other Ambulatory Visit (HOSPITAL_COMMUNITY): Payer: Self-pay

## 2023-04-13 ENCOUNTER — Other Ambulatory Visit: Payer: Self-pay

## 2023-04-13 ENCOUNTER — Other Ambulatory Visit (HOSPITAL_COMMUNITY): Payer: Self-pay

## 2023-04-13 MED ORDER — HYDROCHLOROTHIAZIDE 25 MG PO TABS
25.0000 mg | ORAL_TABLET | ORAL | 0 refills | Status: DC
  Filled 2023-04-13: qty 90, 90d supply, fill #0

## 2023-04-13 MED ORDER — AMLODIPINE BESYLATE 10 MG PO TABS
10.0000 mg | ORAL_TABLET | Freq: Every day | ORAL | 0 refills | Status: DC
  Filled 2023-04-13: qty 90, 90d supply, fill #0

## 2023-04-13 MED ORDER — LOSARTAN POTASSIUM 100 MG PO TABS
100.0000 mg | ORAL_TABLET | Freq: Every day | ORAL | 0 refills | Status: DC
  Filled 2023-04-13: qty 90, 90d supply, fill #0

## 2023-04-27 ENCOUNTER — Other Ambulatory Visit (HOSPITAL_COMMUNITY): Payer: Self-pay

## 2023-04-30 ENCOUNTER — Other Ambulatory Visit (HOSPITAL_COMMUNITY): Payer: Self-pay

## 2023-04-30 MED ORDER — METFORMIN HCL ER 500 MG PO TB24
500.0000 mg | ORAL_TABLET | Freq: Two times a day (BID) | ORAL | 0 refills | Status: AC
  Filled 2023-04-30: qty 180, 90d supply, fill #0

## 2023-07-17 ENCOUNTER — Other Ambulatory Visit (HOSPITAL_COMMUNITY): Payer: Self-pay

## 2023-07-18 ENCOUNTER — Other Ambulatory Visit (HOSPITAL_COMMUNITY): Payer: Self-pay

## 2023-07-18 MED ORDER — AMLODIPINE BESYLATE 10 MG PO TABS
10.0000 mg | ORAL_TABLET | Freq: Every day | ORAL | 0 refills | Status: DC
  Filled 2023-07-18: qty 90, 90d supply, fill #0

## 2023-07-18 MED ORDER — HYDROCHLOROTHIAZIDE 25 MG PO TABS
25.0000 mg | ORAL_TABLET | Freq: Every morning | ORAL | 0 refills | Status: DC
  Filled 2023-07-18: qty 90, 90d supply, fill #0

## 2023-07-18 MED ORDER — LOSARTAN POTASSIUM 100 MG PO TABS
100.0000 mg | ORAL_TABLET | Freq: Every day | ORAL | 0 refills | Status: DC
  Filled 2023-07-18: qty 90, 90d supply, fill #0

## 2023-07-23 ENCOUNTER — Other Ambulatory Visit (HOSPITAL_COMMUNITY): Payer: Self-pay

## 2023-07-23 MED ORDER — METFORMIN HCL ER 500 MG PO TB24
500.0000 mg | ORAL_TABLET | Freq: Two times a day (BID) | ORAL | 3 refills | Status: DC
  Filled 2023-07-23: qty 180, 90d supply, fill #0
  Filled 2023-10-23: qty 180, 90d supply, fill #1
  Filled 2024-02-05: qty 180, 90d supply, fill #2
  Filled 2024-05-08: qty 180, 90d supply, fill #3

## 2023-08-08 ENCOUNTER — Other Ambulatory Visit (HOSPITAL_COMMUNITY): Payer: Self-pay

## 2023-09-21 ENCOUNTER — Other Ambulatory Visit (HOSPITAL_COMMUNITY): Payer: Self-pay

## 2023-09-23 ENCOUNTER — Other Ambulatory Visit (HOSPITAL_COMMUNITY): Payer: Self-pay

## 2023-09-24 ENCOUNTER — Other Ambulatory Visit (HOSPITAL_COMMUNITY): Payer: Self-pay

## 2023-09-24 MED ORDER — PEG 3350-KCL-NA BICARB-NACL 420 G PO SOLR
ORAL | 0 refills | Status: AC
Start: 2023-09-24 — End: ?
  Filled 2023-09-24: qty 4000, 1d supply, fill #0

## 2023-09-24 MED ORDER — BISACODYL 5 MG PO TBEC
DELAYED_RELEASE_TABLET | ORAL | 0 refills | Status: AC
  Filled 2023-09-24: qty 4, 1d supply, fill #0

## 2023-10-12 ENCOUNTER — Other Ambulatory Visit (HOSPITAL_COMMUNITY): Payer: Self-pay

## 2023-10-12 MED ORDER — HYDROCHLOROTHIAZIDE 25 MG PO TABS
25.0000 mg | ORAL_TABLET | Freq: Every morning | ORAL | 3 refills | Status: DC
  Filled 2023-10-12: qty 90, 90d supply, fill #0
  Filled 2023-12-24: qty 90, 90d supply, fill #1
  Filled 2024-03-20: qty 90, 90d supply, fill #2
  Filled 2024-07-25: qty 90, 90d supply, fill #3

## 2023-10-12 MED ORDER — AMLODIPINE BESYLATE 10 MG PO TABS
10.0000 mg | ORAL_TABLET | Freq: Every day | ORAL | 3 refills | Status: DC
  Filled 2023-10-12: qty 90, 90d supply, fill #0
  Filled 2023-12-24: qty 90, 90d supply, fill #1
  Filled 2024-03-20: qty 90, 90d supply, fill #2
  Filled 2024-07-25: qty 90, 90d supply, fill #3

## 2023-10-12 MED ORDER — LOSARTAN POTASSIUM 100 MG PO TABS
100.0000 mg | ORAL_TABLET | Freq: Every day | ORAL | 3 refills | Status: DC
  Filled 2023-10-12: qty 90, 90d supply, fill #0
  Filled 2023-12-24: qty 90, 90d supply, fill #1
  Filled 2024-03-20: qty 90, 90d supply, fill #2
  Filled 2024-07-25: qty 90, 90d supply, fill #3

## 2023-10-23 ENCOUNTER — Other Ambulatory Visit (HOSPITAL_COMMUNITY): Payer: Self-pay

## 2023-10-25 ENCOUNTER — Other Ambulatory Visit (HOSPITAL_COMMUNITY): Payer: Self-pay

## 2023-10-25 MED ORDER — LEVOTHYROXINE SODIUM 100 MCG PO TABS
100.0000 ug | ORAL_TABLET | Freq: Every morning | ORAL | 1 refills | Status: DC
  Filled 2023-10-25: qty 30, 30d supply, fill #0
  Filled 2023-11-20: qty 30, 30d supply, fill #1

## 2023-11-20 ENCOUNTER — Other Ambulatory Visit (HOSPITAL_COMMUNITY): Payer: Self-pay

## 2023-12-21 ENCOUNTER — Other Ambulatory Visit (HOSPITAL_COMMUNITY): Payer: Self-pay

## 2023-12-21 MED ORDER — LEVOTHYROXINE SODIUM 100 MCG PO TABS
100.0000 ug | ORAL_TABLET | Freq: Every morning | ORAL | 3 refills | Status: AC
  Filled 2023-12-21: qty 90, 90d supply, fill #0
  Filled 2024-03-20: qty 90, 90d supply, fill #1
  Filled 2024-06-27: qty 90, 90d supply, fill #2
  Filled 2024-09-29: qty 90, 90d supply, fill #3

## 2023-12-24 ENCOUNTER — Other Ambulatory Visit: Payer: Self-pay

## 2023-12-24 ENCOUNTER — Other Ambulatory Visit (HOSPITAL_COMMUNITY): Payer: Self-pay

## 2024-02-05 ENCOUNTER — Other Ambulatory Visit (HOSPITAL_COMMUNITY): Payer: Self-pay

## 2024-02-27 ENCOUNTER — Other Ambulatory Visit: Payer: Self-pay

## 2024-02-27 ENCOUNTER — Other Ambulatory Visit (HOSPITAL_COMMUNITY): Payer: Self-pay

## 2024-02-27 DIAGNOSIS — M25562 Pain in left knee: Secondary | ICD-10-CM | POA: Diagnosis not present

## 2024-02-27 DIAGNOSIS — M545 Low back pain, unspecified: Secondary | ICD-10-CM | POA: Diagnosis not present

## 2024-02-27 DIAGNOSIS — I10 Essential (primary) hypertension: Secondary | ICD-10-CM | POA: Diagnosis not present

## 2024-02-27 DIAGNOSIS — E7800 Pure hypercholesterolemia, unspecified: Secondary | ICD-10-CM | POA: Diagnosis not present

## 2024-02-27 DIAGNOSIS — M25569 Pain in unspecified knee: Secondary | ICD-10-CM | POA: Diagnosis not present

## 2024-02-27 DIAGNOSIS — M79604 Pain in right leg: Secondary | ICD-10-CM | POA: Diagnosis not present

## 2024-02-27 DIAGNOSIS — E1169 Type 2 diabetes mellitus with other specified complication: Secondary | ICD-10-CM | POA: Diagnosis not present

## 2024-02-27 DIAGNOSIS — M25519 Pain in unspecified shoulder: Secondary | ICD-10-CM | POA: Diagnosis not present

## 2024-02-27 MED ORDER — ROSUVASTATIN CALCIUM 10 MG PO TABS
10.0000 mg | ORAL_TABLET | Freq: Every day | ORAL | 3 refills | Status: AC
  Filled 2024-02-27: qty 90, 90d supply, fill #0
  Filled 2024-07-25: qty 90, 90d supply, fill #1
  Filled 2024-11-13: qty 90, 90d supply, fill #2

## 2024-02-27 MED ORDER — PREDNISONE 10 MG (21) PO TBPK
ORAL_TABLET | ORAL | 0 refills | Status: AC
  Filled 2024-02-27: qty 21, 6d supply, fill #0

## 2024-03-19 ENCOUNTER — Other Ambulatory Visit (HOSPITAL_COMMUNITY): Payer: Self-pay

## 2024-03-19 MED ORDER — PREDNISONE 10 MG (21) PO TBPK
ORAL_TABLET | ORAL | 0 refills | Status: AC
  Filled 2024-03-19: qty 21, 6d supply, fill #0

## 2024-03-20 ENCOUNTER — Other Ambulatory Visit: Payer: Self-pay

## 2024-03-20 ENCOUNTER — Other Ambulatory Visit (HOSPITAL_COMMUNITY): Payer: Self-pay

## 2024-03-20 DIAGNOSIS — T7840XA Allergy, unspecified, initial encounter: Secondary | ICD-10-CM | POA: Diagnosis not present

## 2024-03-20 DIAGNOSIS — L259 Unspecified contact dermatitis, unspecified cause: Secondary | ICD-10-CM | POA: Diagnosis not present

## 2024-03-20 MED ORDER — HYDROXYZINE HCL 25 MG PO TABS
25.0000 mg | ORAL_TABLET | Freq: Three times a day (TID) | ORAL | 0 refills | Status: AC | PRN
  Filled 2024-03-20: qty 30, 10d supply, fill #0

## 2024-03-28 ENCOUNTER — Other Ambulatory Visit (INDEPENDENT_AMBULATORY_CARE_PROVIDER_SITE_OTHER): Payer: Self-pay

## 2024-03-28 ENCOUNTER — Encounter: Payer: Self-pay | Admitting: Orthopaedic Surgery

## 2024-03-28 ENCOUNTER — Ambulatory Visit: Admitting: Orthopaedic Surgery

## 2024-03-28 DIAGNOSIS — M25511 Pain in right shoulder: Secondary | ICD-10-CM

## 2024-03-28 DIAGNOSIS — G8929 Other chronic pain: Secondary | ICD-10-CM | POA: Diagnosis not present

## 2024-03-28 DIAGNOSIS — M25562 Pain in left knee: Secondary | ICD-10-CM | POA: Diagnosis not present

## 2024-03-28 NOTE — Progress Notes (Signed)
 Office Visit Note   Patient: Jeremy Estrada           Date of Birth: [birth year]           MRN: [id number] Visit Date: 03/28/2024              Requested by: Merl Star, MD [address] [place],  [place] [address] PCP: Merl Star, MD   Assessment & Plan: Visit Diagnoses:  1. Chronic right shoulder pain   2. Chronic pain of left knee     Plan: History of Present Illness Jeremy Estrada is a 63 year old male who presents with bilateral knee pain and right shoulder pain.  He experiences significant pain in the left knee, with swelling and a sensation of giving way, especially when carrying items. A previous left knee scope addressed a torn medial meniscus. He does not use medication, cortisone injections, or a knee brace. Physical therapy post-surgery improved strength, but crackling sounds and occasional locking persist.  The right knee shows swelling and occasional giving way, particularly after an incident where he almost fell. Pain occurs when the leg hangs off the couch, but there is no locking or catching.  Right shoulder pain and weakness began after a fall two years ago. Initially, he could not lift his shirt over his head. Function improved but has worsened recently, with pain during activities like tossing a ball and lifting objects over ten pounds. He has not received injections or physical therapy for the shoulder.  He avoids leg exercises at the gym due to concerns about knee damage.  Physical Exam MUSCULOSKELETAL: Left knee with good extension and flexion, pain on flexion, PF crepitus, no effusion, no tenderness along joint line. Right knee with good motion, some PF crepitus, no tenderness along joint line. Right shoulder with good passive motion, pain on exertion, flexion to 170 degrees, external rotation to 45 degrees, abduction greater than 90 degrees. Pain and moderate weakness on empty can test, significant weakness infraspinatus, strong subscapularis with  pain.  Assessment and Plan Osteoarthritis of left knee Chronic osteoarthritis with significant degenerative disease. Discussed interventions: cortisone, gel injections, physical therapy. Knee replacement possible if symptoms worsen. Explained cortisone risks and gel benefits. Emphasized strengthening exercises. - Provide pamphlet on gel injections and knee replacement surgery. - Recommend wearing an osteoarthritis knee brace during activities lasting more than 30 minutes. - Refer to physical therapy for knee strengthening exercises.  Osteoarthritis of right knee Mild to moderate osteoarthritis with crepitus, swelling, and occasional instability. Emphasized strengthening exercises to delay surgery. - Refer to physical therapy for knee strengthening exercises.  Rotator cuff tear, right shoulder Chronic rotator cuff tear causing pain and weakness. Discussed physical therapy benefits. Explained chronic tears less likely repairable, therapy to improve function and reduce pain. - Refer to physical therapy for shoulder strengthening exercises.  Follow-Up Instructions: No follow-ups on file.   Orders:  Orders Placed This Encounter  Procedures   XR Shoulder Right   XR KNEE 3 VIEW LEFT   Ambulatory referral to Physical Therapy   No orders of the defined types were placed in this encounter.    Subjective: Chief Complaint  Patient presents with   Right Shoulder - Pain   Left Knee - Pain      Review of Systems  Constitutional: Negative.   HENT: Negative.    Eyes: Negative.   Respiratory: Negative.    Cardiovascular: Negative.   Gastrointestinal: Negative.   Endocrine: Negative.   Genitourinary: Negative.  Skin: Negative.   Allergic/Immunologic: Negative.   Neurological: Negative.   Hematological: Negative.   Psychiatric/Behavioral: Negative.    All other systems reviewed and are negative.    Objective: Vital Signs: There were no vitals taken for this visit.  Physical  Exam Vitals and nursing note reviewed.  Constitutional:      Appearance: He is well-developed.  HENT:     Head: Normocephalic and atraumatic.  Eyes:     Pupils: Pupils are equal, round, and reactive to light.  Pulmonary:     Effort: Pulmonary effort is normal.  Abdominal:     Palpations: Abdomen is soft.  Musculoskeletal:        General: Normal range of motion.     Cervical back: Neck supple.  Skin:    General: Skin is warm.  Neurological:     Mental Status: He is alert and oriented to person, place, and time.  Psychiatric:        Behavior: Behavior normal.        Thought Content: Thought content normal.        Judgment: Judgment normal.     Imaging: XR Shoulder Right Result Date: 03/28/2024 X-rays of the right shoulder show age-appropriate degenerative changes.  No structural abnormalities.  No acute abnormalities.  XR KNEE 3 VIEW LEFT Result Date: 03/28/2024 X-rays of the left knee show advanced tricompartmental osteoarthritis with significant joint space narrowing and osteophytic changes.    PMFS History: There are no active problems to display for this patient.  History reviewed. No pertinent past medical history.  No family history on file.  Past Surgical History:  Procedure Laterality Date   COLONOSCOPY WITH PROPOFOL  N/A 12/19/2016   Procedure: COLONOSCOPY WITH PROPOFOL ;  Surgeon: Garrett Kallman, MD;  Location: [facility];  Service: Endoscopy;  Laterality: N/A;   KNEE ARTHROSCOPY Left    Social History   Occupational History   Not on file  Tobacco Use   Smoking status: Never   Smokeless tobacco: Never  Substance and Sexual Activity   Alcohol use: Not on file   Drug use: Not on file   Sexual activity: Not on file

## 2024-04-11 NOTE — Therapy (Signed)
 OUTPATIENT PHYSICAL THERAPY EVALUATION   Patient Name: Jeremy Estrada MRN: [id number] DOB:1961/02/18, 63 y.o., male Today's Date: 04/11/2024  END OF SESSION:   No past medical history on file. Past Surgical History:  Procedure Laterality Date   COLONOSCOPY WITH PROPOFOL  N/A 12/19/2016   Procedure: COLONOSCOPY WITH PROPOFOL ;  Surgeon: Garrett Kallman, MD;  Location: [facility];  Service: Endoscopy;  Laterality: N/A;   KNEE ARTHROSCOPY Left    There are no active problems to display for this patient.   PCP: Merl Star MD  REFERRING PROVIDER: Wes Hamman, MD  REFERRING DIAG: [phone] (ICD-10-CM) - Chronic right shoulder pain M25.562,G89.29 (ICD-10-CM) - Chronic pain of left knee  THERAPY DIAG:  No diagnosis found.  Rationale for Evaluation and Treatment: Rehabilitation  ONSET DATE: ***  SUBJECTIVE:   SUBJECTIVE STATEMENT: ***  PERTINENT HISTORY: *** PAIN:  NPRS scale: ***/10 Pain location: *** Pain description: *** Aggravating factors: *** Relieving factors: ***  PRECAUTIONS: {Therapy precautions:24002}  WEIGHT BEARING RESTRICTIONS: {Yes ***/No:24003}  FALLS:  Has patient fallen in last 6 months? {fallsyesno:27318}  LIVING ENVIRONMENT: Lives with: {[facility] lives with:25569::lives with their family} Lives in: {Lives in:25570} Stairs: {opstairs:27293} Has following equipment at home: {Assistive devices:23999}  OCCUPATION: ***  PLOF: Independent  PATIENT GOALS: ***  Next MD visit:   OBJECTIVE:   DIAGNOSTIC FINDINGS: ***  PATIENT SURVEYS:  Patient-Specific Activity Scoring Scheme  0 represents "unable to perform." 10 represents "able to perform at prior level. 0 1 2 3 4 5 6 7 8 9  10 (Date and Score)   Activity Eval     1. ***      2. ***      3. ***    4.    5.    Score ***    Total score = sum of the activity scores/number of activities Minimum detectable change (90%CI) for average score = 2 points Minimum detectable  change (90%CI) for single activity score = 3 points  COGNITION: Overall cognitive status: WFL    SENSATION: {sensation:27233}  EDEMA:  {edema:24020}  MUSCLE LENGTH: Hamstrings: Right *** deg; Left *** deg Thomas test: Right *** deg; Left *** deg  POSTURE:  {posture:25561}  PALPATION: ***  UPPER EXTREMITY ROM:  ROM Right eval Left eval  Shoulder flexion    Shoulder extension    Shoulder abduction    Shoulder adduction    Shoulder extension    Shoulder internal rotation    Shoulder external rotation    Elbow flexion    Elbow extension    Wrist flexion    Wrist extension    Wrist ulnar deviation    Wrist radial deviation    Wrist pronation    Wrist supination     (Blank rows = not tested)   UPPER EXTREMITY MMT:  MMT Right eval Left eval  Shoulder flexion    Shoulder extension    Shoulder abduction    Shoulder adduction    Shoulder extension    Shoulder internal rotation    Shoulder external rotation    Middle trapezius    Lower trapezius    Elbow flexion    Elbow extension    Wrist flexion    Wrist extension    Wrist ulnar deviation    Wrist radial deviation    Wrist pronation    Wrist supination    Grip strength     (Blank rows = not tested)   LOWER EXTREMITY ROM:   ROM Right eval Left eval  Hip flexion  Hip extension    Hip abduction    Hip adduction    Hip internal rotation    Hip external rotation    Knee flexion    Knee extension    Ankle dorsiflexion    Ankle plantarflexion    Ankle inversion    Ankle eversion     (Blank rows = not tested)  LOWER EXTREMITY MMT:  MMT Right eval Left eval  Hip flexion    Hip extension    Hip abduction    Hip adduction    Hip internal rotation    Hip external rotation    Knee flexion    Knee extension    Ankle dorsiflexion    Ankle plantarflexion    Ankle inversion    Ankle eversion     (Blank rows = not tested)  LOWER EXTREMITY SPECIAL TESTS:   {LEspecialtests:26242}  FUNCTIONAL TESTS:  18 inch chair transfer: Lt SLS: Rt SLS:  GAIT: Distance walked: *** Assistive device utilized: {Assistive devices:23999} Level of assistance: {Levels of assistance:24026} Comments: ***                                                                                                                                                                        TODAY'S TREATMENT                                                                          DATE: Therex:    HEP instruction/performance c cues for techniques, handout provided.  Trial set performed of each for comprehension and symptom assessment.  See below for exercise list  PATIENT EDUCATION:  Education details: HEP, POC Person educated: Patient Education method: Explanation, Demonstration, Verbal cues, and Handouts Education comprehension: verbalized understanding, returned demonstration, and verbal cues required  HOME EXERCISE PROGRAM: ***  ASSESSMENT:  CLINICAL IMPRESSION: Patient is a 63 y.o. who comes to clinic with complaints of ***pain with mobility, strength and movement coordination deficits that impair their ability to perform usual daily and recreational functional activities without increase difficulty/symptoms at this time.  Patient to benefit from skilled PT services to address impairments and limitations to improve to previous level of function without restriction secondary to condition.   OBJECTIVE IMPAIRMENTS: {opptimpairments:25111}.   ACTIVITY LIMITATIONS: {activitylimitations:27494}  PARTICIPATION LIMITATIONS: {participationrestrictions:25113}  PERSONAL FACTORS: {Personal factors:25162} are also affecting patient's functional outcome.   REHAB POTENTIAL: {rehabpotential:25112}  CLINICAL DECISION MAKING: {clinical decision making:25114}  EVALUATION COMPLEXITY: {Evaluation complexity:25115}   GOALS: Goals  reviewed with patient? Yes  SHORT TERM GOALS:  (target date for Short term goals are 3 weeks ***)   1.  Patient will demonstrate independent use of home exercise program to maintain progress from in clinic treatments.  Goal status: New  LONG TERM GOALS: (target dates for all long term goals are 10 weeks  *** )   1. Patient will demonstrate/report pain at worst less than or equal to 2/10 to facilitate minimal limitation in daily activity secondary to pain symptoms.  Goal status: New   2. Patient will demonstrate independent use of home exercise program to facilitate ability to maintain/progress functional gains from skilled physical therapy services.  Goal status: New   3. Patient will demonstrate Patient specific functional scale avg > or = *** to indicate reduced disability due to condition.   Goal status: New   4.  Patient will demonstrate *** LE MMT 5/5 throughout to faciltiate usual transfers, stairs, squatting at [facility] for daily life.   Goal status: New   5.  Patient will demonstrate Goal status: New   6.  *** Goal status: New   7.  *** Goal Status: New   PLAN:  PT FREQUENCY: 1-2x/week  PT DURATION: 10 weeks  PLANNED INTERVENTIONS: Can include 96045- PT Re-evaluation, 97110-Therapeutic exercises, 97530- Therapeutic activity, [id number]- Neuromuscular re-education, 97535- Self Care, 97140- Manual therapy, [phone]- Gait training, [phone]- Orthotic Fit/training, [phone]- Canalith repositioning, [id number]- Aquatic Therapy, [phone]- Electrical stimulation (unattended), [id number] Physical performance testing, 97016- Vasopneumatic device, [id number]- Ultrasound, [id number]- Traction (mechanical), [id number]- Ionotophoresis 4mg /ml Dexamethasone,  20560 - Needle insertion w/o injection 1 or 2 muscles, 20561 - Needle insertion w/o injection 3 or more muscles.   Patient/Family education, Balance training, Stair training, Taping, Dry Needling, Joint mobilization, Joint manipulation, Spinal manipulation, Spinal mobilization, Scar mobilization, Vestibular training,  Visual/preceptual remediation/compensation, DME instructions, Cryotherapy, and Moist heat.  All performed as medically necessary.  All included unless contraindicated  PLAN FOR NEXT SESSION: Review HEP knowledge/results.    Bonna Bustard, PT, DPT, OCS, ATC 04/11/24  7:39 AM

## 2024-04-14 ENCOUNTER — Encounter: Payer: Self-pay | Admitting: Rehabilitative and Restorative Service Providers"

## 2024-04-14 ENCOUNTER — Ambulatory Visit: Admitting: Rehabilitative and Restorative Service Providers"

## 2024-04-14 DIAGNOSIS — R262 Difficulty in walking, not elsewhere classified: Secondary | ICD-10-CM | POA: Diagnosis not present

## 2024-04-14 DIAGNOSIS — M25511 Pain in right shoulder: Secondary | ICD-10-CM

## 2024-04-14 DIAGNOSIS — G8929 Other chronic pain: Secondary | ICD-10-CM | POA: Diagnosis not present

## 2024-04-14 DIAGNOSIS — M25562 Pain in left knee: Secondary | ICD-10-CM | POA: Diagnosis not present

## 2024-04-14 DIAGNOSIS — M6281 Muscle weakness (generalized): Secondary | ICD-10-CM | POA: Diagnosis not present

## 2024-04-21 ENCOUNTER — Ambulatory Visit: Admitting: Physical Therapy

## 2024-04-21 ENCOUNTER — Encounter: Payer: Self-pay | Admitting: Physical Therapy

## 2024-04-21 DIAGNOSIS — M6281 Muscle weakness (generalized): Secondary | ICD-10-CM

## 2024-04-21 DIAGNOSIS — G8929 Other chronic pain: Secondary | ICD-10-CM

## 2024-04-21 DIAGNOSIS — R262 Difficulty in walking, not elsewhere classified: Secondary | ICD-10-CM | POA: Diagnosis not present

## 2024-04-21 DIAGNOSIS — M25562 Pain in left knee: Secondary | ICD-10-CM

## 2024-04-21 DIAGNOSIS — M25511 Pain in right shoulder: Secondary | ICD-10-CM | POA: Diagnosis not present

## 2024-04-21 NOTE — Therapy (Signed)
 OUTPATIENT PHYSICAL THERAPY TREATMENT   Patient Name: Jeremy Estrada MRN: [id number] DOB:Oct 31, 1960, 63 y.o., male Today's Date: 04/21/2024  END OF SESSION:  PT End of Session - 04/21/24 1150     Visit Number 2    Number of Visits 20    Date for PT Re-Evaluation 06/23/24    Authorization Type [employer] $20 copay    Progress Note Due on Visit 10    PT Start Time 1147    PT Stop Time 1228    PT Time Calculation (min) 41 min    Activity Tolerance Patient tolerated treatment well    Behavior During Therapy [facility] for tasks assessed/performed           History reviewed. No pertinent past medical history. Past Surgical History:  Procedure Laterality Date   COLONOSCOPY WITH PROPOFOL  N/A 12/19/2016   Procedure: COLONOSCOPY WITH PROPOFOL ;  Surgeon: Gladis MARLA Louder, MD;  Location: [facility];  Service: Endoscopy;  Laterality: N/A;   KNEE ARTHROSCOPY Left    There are no active problems to display for this patient.   PCP: Rexanne Ingle MD  REFERRING PROVIDER: Jerri Kay HERO, MD  REFERRING DIAG: [phone] (ICD-10-CM) - Chronic right shoulder pain M25.562,G89.29 (ICD-10-CM) - Chronic pain of left knee  THERAPY DIAG:  Chronic pain of left knee  Chronic right shoulder pain  Difficulty in walking, not elsewhere classified  Muscle weakness (generalized)  Rationale for Evaluation and Treatment: Rehabilitation  ONSET DATE: Chronic complaints, worsened last 1-2 years  SUBJECTIVE:   SUBJECTIVE STATEMENT: Felt like the shoulder exercises made his arm a little more uncomfortable.  Otherwise pain is pretty minimal  PERTINENT HISTORY: See above  PAIN:  NPRS scale: knee at worst 8-9/10, shoulder currently 2/10.  Pain location: Rt shoulder, Lt knee > Rt Pain description: knee: sharp at times, everyday achy Aggravating factors: knee: full bending, WB pressure, going up stairs  , shoulder:  throwing motion, overhead lifting/reaching.  Relieving factors: ibuprofen for  shoulder  PRECAUTIONS: None  WEIGHT BEARING RESTRICTIONS: No  FALLS:  Has patient fallen in last 6 months? No  LIVING ENVIRONMENT: Lives in: House/apartment Stairs: stairs but not to bedroom.   OCCUPATION: Retired   PLOF: Independent, previously used gym until the last few months, yardwork  PATIENT GOALS: Reduce pain  OBJECTIVE:   IMAGING 03/28/2024 xray knee: X-rays of the left knee show advanced tricompartmental osteoarthritis with  significant joint space narrowing and osteophytic changes.   03/28/2024 xray shoulder X-rays of the right shoulder show age-appropriate degenerative changes.   No structural abnormalities.  No acute abnormalities.   PATIENT SURVEYS:  Patient-Specific Activity Scoring Scheme  0 represents "unable to perform." 10 represents "able to perform at prior level. 0 1 2 3 4 5 6 7 8 9  10 (Date and Score)   Activity Eval  04/14/2024    1. Stairs with weight  6    2. Getting off   2    3. Run 6   4. Lifting things away from body 2   5.    Score 4    Total score = sum of the activity scores/number of activities Minimum detectable change (90%CI) for average score = 2 points Minimum detectable change (90%CI) for single activity score = 3 points  COGNITION: 04/14/2024 Overall cognitive status: WFL    SENSATION: 04/14/2024 No specific testing today  EDEMA:  04/14/2024 No specific joint edema observed/measured today  MUSCLE LENGTH: 04/14/2024 No specific testing today  POSTURE:  04/14/2024 Rounded shoulders bilateral  PALPATION 04/14/2024 Trigger points in Rt infraspinatus, Rt upper trap  UPPER EXTREMITY ROM:  ROM Right Eval 04/14/2024 Left Eval 04/14/2024  Shoulder flexion [facility]  Shoulder extension    Shoulder abduction [facility]  Shoulder adduction    Shoulder extension    Shoulder internal rotation 62 in 75 deg abduction supine AROM   Shoulder external rotation 80 in 75 deg abduction supine AROM    (Blank rows = not  tested)   UPPER EXTREMITY MMT:  MMT Right Eval 04/14/2024 Left Eval 04/14/2024  Shoulder flexion 5/5 5/5  Shoulder extension    Shoulder abduction 5/5 5/5  Shoulder adduction    Shoulder extension    Shoulder internal rotation 5/5 5/5  Shoulder external rotation 3/5 5/5   (Blank rows = not tested)   LOWER EXTREMITY ROM:   ROM Right Eval 04/14/2024 Left Eval 04/14/2024  Hip flexion    Hip extension    Hip abduction    Hip adduction    Hip internal rotation    Hip external rotation    Knee flexion 115 c pain 105 c pain  Knee extension    Ankle dorsiflexion    Ankle plantarflexion    Ankle inversion    Ankle eversion     (Blank rows = not tested)  LOWER EXTREMITY MMT:  MMT Right Eval 04/14/2024 Left Eval 04/14/2024  Hip flexion 5/5 5/5  Knee flexion 5/5 5/5  Knee extension 5/5 82, 84 lbs 4/5 40, 37 lbs  Ankle dorsiflexion 5/5 5/5   (Blank rows = not tested)  SPECIAL TESTS:  04/14/2024 (-) Painful arc, drop arm, empty can, lift off on Rt shoulder.  (+) ER lag noted  FUNCTIONAL TESTS:  04/14/2024 18 inch chair transfer: able on 1st attempt with deviation to Rt leg WB Lt SLS: < 5 seconds Rt SLS: < 5 seconds  GAIT: 04/14/2024 Independent ambulation on level surfaces without noted changes.                                                                                                                                                                         TODAY'S TREATMENT 04/21/24 TherEx NuStep L6 x 10 min Scapular retraction 10x5 sec hol Crossbody posterior capsule stretch 5x15 sec Seated Lt quad sets 2x10; 5 sec hold Seated Lt SLR 2x10; 3 sec hold with cues to decrease ext lag Rt shoulder ER isometric 20x5 sec hold Rows L4 band 2x10; 5 sec hold   04/14/2024 Therex:    HEP instruction/performance c cues for techniques, handout provided.  Trial set performed of each for comprehension and symptom assessment.  See below for exercise list  Self  Care Education on gym activity with pain montioring scale focus for adapting  or withhold activity (focused on keeping low   PATIENT EDUCATION:  04/14/2024 Education details: HEP, POC Person educated: Patient Education method: Explanation, Demonstration, Verbal cues, and Handouts Education comprehension: verbalized understanding, returned demonstration, and verbal cues required  HOME EXERCISE PROGRAM: Access Code: [email or web address] URL: [email or web address] Date: 04/14/2024 Prepared by: Ozell Silvan  Exercises - Seated Scapular Retraction  - 3-5 x daily - 7 x weekly - 1 sets - 10 reps - 3-5 hold - Standing Shoulder Posterior Capsule Stretch (Mirrored)  - 2 x daily - 7 x weekly - 1 sets - 5 reps - 15 hold - Standing Isometric Shoulder External Rotation with Doorway (Mirrored)  - 1-2 x daily - 7 x weekly - 1 sets - 6-8 reps - 10 hold - Seated Quad Set (Mirrored)  - 3-5 x daily - 7 x weekly - 1 sets - 10 reps - 5 hold - Seated Straight Leg Heel Taps  - 1-2 x daily - 7 x weekly - 3 sets - 10 reps  ASSESSMENT:  CLINICAL IMPRESSION: Pt tolerated session well today with main focus review of HEP needing mod cues today.  No goals met as only 2nd visit.  Continue skilled PT.   OBJECTIVE IMPAIRMENTS: decreased activity tolerance, decreased balance, decreased coordination, decreased endurance, decreased mobility, difficulty walking, decreased ROM, decreased strength, hypomobility, increased fascial restrictions, impaired perceived functional ability, increased muscle spasms, impaired flexibility, impaired UE functional use, improper body mechanics, postural dysfunction, and pain.   ACTIVITY LIMITATIONS: carrying, lifting, bending, standing, squatting, sleeping, stairs, transfers, reach over head, and locomotion level  PARTICIPATION LIMITATIONS: meal prep, cleaning, laundry, interpersonal relationship, community activity, yard work, and exercise routine  PERSONAL FACTORS: Time since onset  of injury/illness/exacerbation and multiple body parts are also affecting patient's functional outcome.   REHAB POTENTIAL: Good  CLINICAL DECISION MAKING: Evolving/moderate complexity  EVALUATION COMPLEXITY: Moderate   GOALS: Goals reviewed with patient? Yes  SHORT TERM GOALS: (target date for Short term goals are 3 weeks 05/05/2024)   1.  Patient will demonstrate independent use of home exercise program to maintain progress from in clinic treatments. Goal status: New  LONG TERM GOALS: (target dates for all long term goals are 10 weeks  06/23/2024 )   1. Patient will demonstrate/report pain at worst less than or equal to 2/10 to facilitate minimal limitation in daily activity secondary to pain symptoms. Goal status: New   2. Patient will demonstrate independent use of home exercise program to facilitate ability to maintain/progress functional gains from skilled physical therapy services. Goal status: New   3. Patient will demonstrate Patient specific functional scale avg > or = 8/10 to indicate reduced disability due to condition.   Goal status: New   4.  Patient will demonstrate bilateral  LE MMT 5/5, Lt knee extension dynamometry with 15% of Rt to faciltiate usual transfers, stairs, squatting at PLOF for daily life.   Goal status: New   5.  Patient will demonstrate Lt shoulder MMT 5/5 throughout to facilitate usual reaching, lifting, carrying at PLOF s limitation.  Goal status: New   6.  Patient will demonstrate ascending/descending stairs reciprocal gait pattern.  Goal status: New   7.  Patient will demonstrate bilateral SLS > 10 seconds to facilitate stability in ambulation.  Goal Status: New   PLAN:  PT FREQUENCY: 1-2x/week  PT DURATION: 10 weeks  PLANNED INTERVENTIONS: Can include 02853- PT Re-evaluation, 97110-Therapeutic exercises, 97530- Therapeutic activity, [id number]- Neuromuscular re-education, 97535- Self Care, 97140- Manual therapy, [id number]- Gait training, [phone]-  Orthotic Fit/training, 04007- Canalith repositioning, 02886- Aquatic Therapy, [phone]- Electrical stimulation (unattended), [id number] Physical performance testing, 02983- Vasopneumatic device, [id number]- Ultrasound, [id number]- Traction (mechanical), [id number]- Ionotophoresis 4mg /ml Dexamethasone,  20560 - Needle insertion w/o injection 1 or 2 muscles, 20561 - Needle insertion w/o injection 3 or more muscles.   Patient/Family education, Balance training, Stair training, Taping, Dry Needling, Joint mobilization, Joint manipulation, Spinal manipulation, Spinal mobilization, Scar mobilization, Vestibular training, Visual/preceptual remediation/compensation, DME instructions, Cryotherapy, and Moist heat.  All performed as medically necessary.  All included unless contraindicated  PLAN FOR NEXT SESSION: review HEP PRN, quad/hamstring strengthening, postural/shoulder exercises   Corean JULIANNA Ku, PT, DPT 04/21/24 12:50 PM   Date of referral: 03/28/2024 Referring provider: Jerri Kay HERO, MD Referring diagnosis? M25.511,G89.29 (ICD-10-CM) - Chronic right shoulder pain M25.562,G89.29 (ICD-10-CM) - Chronic pain of left knee Treatment diagnosis? (if different than referring diagnosis) same  What was this (referring dx) caused by? Ongoing Issue and Arthritis  Lysle of Condition: Chronic (continuous duration > 3 months)   Laterality: Both  Current Functional Measure Score: Patient Specific Functional Scale 4/10 avg today  Objective measurements identify impairments when they are compared to normal values, the uninvolved extremity, and prior level of function.  [x]  Yes  []  No  Objective assessment of functional ability: Moderate functional limitations   Briefly describe symptoms: Pt indicated history of scope on Lt knee years ago with complaints of continued pain with crackling/popping noise.  Does reported some trouble with both but Lt > Rt.  Reported trouble with stairs going up more than down with some giving  way.  Reported full bending trouble as well.   Rt shoulder indicated to possibly be connected to fall while mowing several years ago.  Reported increased trouble for some time but recovered some but not completely improved.  Limiting in motion and lifting due to Rt shoulder symptoms.  Limited in workout routine.   Pt indicated sleeping can impact sleep at times.   How did symptoms start: Insidious onset  Average pain intensity:  Last 24 hours: up to 8-9/10  Past week: up to 8-9/10  How often does the pt experience symptoms? Frequently  How much have the symptoms interfered with usual daily activities? Quite a bit  How has condition changed since care began at this facility? NA - initial visit  In general, how is the patients overall health? Good   BACK PAIN (STarT Back Screening Tool) No

## 2024-04-28 ENCOUNTER — Encounter: Admitting: Rehabilitative and Restorative Service Providers"

## 2024-04-28 NOTE — Therapy (Incomplete)
 OUTPATIENT PHYSICAL THERAPY TREATMENT   Patient Name: Jeremy Estrada MRN: [id number] DOB:December 14, 1960, 63 y.o., male Today's Date: 04/28/2024  END OF SESSION:     No past medical history on file. Past Surgical History:  Procedure Laterality Date   COLONOSCOPY WITH PROPOFOL  N/A 12/19/2016   Procedure: COLONOSCOPY WITH PROPOFOL ;  Surgeon: Gladis MARLA Louder, MD;  Location: [facility];  Service: Endoscopy;  Laterality: N/A;   KNEE ARTHROSCOPY Left    There are no active problems to display for this patient.   PCP: Rexanne Ingle MD  REFERRING PROVIDER: Jerri Kay HERO, MD  REFERRING DIAG: [id number] (ICD-10-CM) - Chronic right shoulder pain M25.562,G89.29 (ICD-10-CM) - Chronic pain of left knee  THERAPY DIAG:  No diagnosis found.  Rationale for Evaluation and Treatment: Rehabilitation  ONSET DATE: Chronic complaints, worsened last 1-2 years  SUBJECTIVE:   SUBJECTIVE STATEMENT: Felt like the shoulder exercises made his arm a little more uncomfortable.  Otherwise pain is pretty minimal  PERTINENT HISTORY: See above  PAIN:  NPRS scale: knee at worst 8-9/10, shoulder currently 2/10.  Pain location: Rt shoulder, Lt knee > Rt Pain description: knee: sharp at times, everyday achy Aggravating factors: knee: full bending, WB pressure, going up stairs  , shoulder:  throwing motion, overhead lifting/reaching.  Relieving factors: ibuprofen for shoulder  PRECAUTIONS: None  WEIGHT BEARING RESTRICTIONS: No  FALLS:  Has patient fallen in last 6 months? No  LIVING ENVIRONMENT: Lives in: House/apartment Stairs: stairs but not to bedroom.   OCCUPATION: Retired   PLOF: Independent, previously used gym until the last few months, yardwork  PATIENT GOALS: Reduce pain  OBJECTIVE:   IMAGING 03/28/2024 xray knee: X-rays of the left knee show advanced tricompartmental osteoarthritis with  significant joint space narrowing and osteophytic changes.   03/28/2024 xray shoulder X-rays of  the right shoulder show age-appropriate degenerative changes.   No structural abnormalities.  No acute abnormalities.   PATIENT SURVEYS:  Patient-Specific Activity Scoring Scheme  0 represents "unable to perform." 10 represents "able to perform at prior level. 0 1 2 3 4 5 6 7 8 9  10 (Date and Score)   Activity Eval  04/14/2024    1. Stairs with weight  6    2. Getting off   2    3. Run 6   4. Lifting things away from body 2   5.    Score 4    Total score = sum of the activity scores/number of activities Minimum detectable change (90%CI) for average score = 2 points Minimum detectable change (90%CI) for single activity score = 3 points  COGNITION: 04/14/2024 Overall cognitive status: WFL    SENSATION: 04/14/2024 No specific testing today  EDEMA:  04/14/2024 No specific joint edema observed/measured today  MUSCLE LENGTH: 04/14/2024 No specific testing today  POSTURE:  04/14/2024 Rounded shoulders bilateral   PALPATION 04/14/2024 Trigger points in Rt infraspinatus, Rt upper trap  UPPER EXTREMITY ROM:  ROM Right Eval 04/14/2024 Left Eval 04/14/2024  Shoulder flexion [facility]  Shoulder extension    Shoulder abduction [facility]  Shoulder adduction    Shoulder extension    Shoulder internal rotation 62 in 75 deg abduction supine AROM   Shoulder external rotation 80 in 75 deg abduction supine AROM    (Blank rows = not tested)   UPPER EXTREMITY MMT:  MMT Right Eval 04/14/2024 Left Eval 04/14/2024  Shoulder flexion 5/5 5/5  Shoulder extension    Shoulder abduction 5/5 5/5  Shoulder adduction    Shoulder  extension    Shoulder internal rotation 5/5 5/5  Shoulder external rotation 3/5 5/5   (Blank rows = not tested)   LOWER EXTREMITY ROM:   ROM Right Eval 04/14/2024 Left Eval 04/14/2024  Hip flexion    Hip extension    Hip abduction    Hip adduction    Hip internal rotation    Hip external rotation    Knee flexion 115 c pain 105 c pain  Knee  extension    Ankle dorsiflexion    Ankle plantarflexion    Ankle inversion    Ankle eversion     (Blank rows = not tested)  LOWER EXTREMITY MMT:  MMT Right Eval 04/14/2024 Left Eval 04/14/2024  Hip flexion 5/5 5/5  Knee flexion 5/5 5/5  Knee extension 5/5 82, 84 lbs 4/5 40, 37 lbs  Ankle dorsiflexion 5/5 5/5   (Blank rows = not tested)  SPECIAL TESTS:  04/14/2024 (-) Painful arc, drop arm, empty can, lift off on Rt shoulder.  (+) ER lag noted  FUNCTIONAL TESTS:  04/14/2024 18 inch chair transfer: able on 1st attempt with deviation to Rt leg WB Lt SLS: < 5 seconds Rt SLS: < 5 seconds  GAIT: 04/14/2024 Independent ambulation on level surfaces without noted changes.                                                                                                                                                                         TODAY'S TREATMENT     DATE:  04/28/2024 Therex:   Manual:    TODAY'S TREATMENT     DATE: 04/21/24 TherEx NuStep L6 x 10 min Scapular retraction 10x5 sec hol Crossbody posterior capsule stretch 5x15 sec Seated Lt quad sets 2x10; 5 sec hold Seated Lt SLR 2x10; 3 sec hold with cues to decrease ext lag Rt shoulder ER isometric 20x5 sec hold Rows L4 band 2x10; 5 sec hold   TODAY'S TREATMENT     DATE: 04/14/2024 Therex:    HEP instruction/performance c cues for techniques, handout provided.  Trial set performed of each for comprehension and symptom assessment.  See below for exercise list  Self Care Education on gym activity with pain montioring scale focus for adapting or withhold activity (focused on keeping low   PATIENT EDUCATION:  04/14/2024 Education details: HEP, POC Person educated: Patient Education method: Explanation, Demonstration, Verbal cues, and Handouts Education comprehension: verbalized understanding, returned demonstration, and verbal cues required  HOME EXERCISE PROGRAM: Access Code: [email or web address] URL:  [email or web address] Date: 04/14/2024 Prepared by: Ozell Silvan  Exercises - Seated Scapular Retraction  - 3-5 x daily - 7 x weekly - 1 sets - 10 reps - 3-5 hold - Standing Shoulder Posterior Capsule Stretch (Mirrored)  -  2 x daily - 7 x weekly - 1 sets - 5 reps - 15 hold - Standing Isometric Shoulder External Rotation with Doorway (Mirrored)  - 1-2 x daily - 7 x weekly - 1 sets - 6-8 reps - 10 hold - Seated Quad Set (Mirrored)  - 3-5 x daily - 7 x weekly - 1 sets - 10 reps - 5 hold - Seated Straight Leg Heel Taps  - 1-2 x daily - 7 x weekly - 3 sets - 10 reps  ASSESSMENT:  CLINICAL IMPRESSION: Pt tolerated session well today with main focus review of HEP needing mod cues today.  No goals met as only 2nd visit.  Continue skilled PT.   OBJECTIVE IMPAIRMENTS: decreased activity tolerance, decreased balance, decreased coordination, decreased endurance, decreased mobility, difficulty walking, decreased ROM, decreased strength, hypomobility, increased fascial restrictions, impaired perceived functional ability, increased muscle spasms, impaired flexibility, impaired UE functional use, improper body mechanics, postural dysfunction, and pain.   ACTIVITY LIMITATIONS: carrying, lifting, bending, standing, squatting, sleeping, stairs, transfers, reach over head, and locomotion level  PARTICIPATION LIMITATIONS: meal prep, cleaning, laundry, interpersonal relationship, community activity, yard work, and exercise routine  PERSONAL FACTORS: Time since onset of injury/illness/exacerbation and multiple body parts are also affecting patient's functional outcome.   REHAB POTENTIAL: Good  CLINICAL DECISION MAKING: Evolving/moderate complexity  EVALUATION COMPLEXITY: Moderate   GOALS: Goals reviewed with patient? Yes  SHORT TERM GOALS: (target date for Short term goals are 3 weeks 05/05/2024)   1.  Patient will demonstrate independent use of home exercise program to maintain progress  from in clinic treatments. Goal status: New  LONG TERM GOALS: (target dates for all long term goals are 10 weeks  06/23/2024 )   1. Patient will demonstrate/report pain at worst less than or equal to 2/10 to facilitate minimal limitation in daily activity secondary to pain symptoms. Goal status: New   2. Patient will demonstrate independent use of home exercise program to facilitate ability to maintain/progress functional gains from skilled physical therapy services. Goal status: New   3. Patient will demonstrate Patient specific functional scale avg > or = 8/10 to indicate reduced disability due to condition.   Goal status: New   4.  Patient will demonstrate bilateral  LE MMT 5/5, Lt knee extension dynamometry with 15% of Rt to faciltiate usual transfers, stairs, squatting at PLOF for daily life.   Goal status: New   5.  Patient will demonstrate Lt shoulder MMT 5/5 throughout to facilitate usual reaching, lifting, carrying at PLOF s limitation.  Goal status: New   6.  Patient will demonstrate ascending/descending stairs reciprocal gait pattern.  Goal status: New   7.  Patient will demonstrate bilateral SLS > 10 seconds to facilitate stability in ambulation.  Goal Status: New   PLAN:  PT FREQUENCY: 1-2x/week  PT DURATION: 10 weeks  PLANNED INTERVENTIONS: Can include 02853- PT Re-evaluation, 97110-Therapeutic exercises, 97530- Therapeutic activity, [id number]- Neuromuscular re-education, 97535- Self Care, 97140- Manual therapy, [phone]- Gait training, [phone]- Orthotic Fit/training, [phone]- Canalith repositioning, [id number]- Aquatic Therapy, [phone]- Electrical stimulation (unattended), [id number] Physical performance testing, 97016- Vasopneumatic device, [id number]- Ultrasound, [id number]- Traction (mechanical), [id number]- Ionotophoresis 4mg /ml Dexamethasone,  20560 - Needle insertion w/o injection 1 or 2 muscles, 20561 - Needle insertion w/o injection 3 or more muscles.   Patient/Family education, Balance training,  Stair training, Taping, Dry Needling, Joint mobilization, Joint manipulation, Spinal manipulation, Spinal mobilization, Scar mobilization, Vestibular training, Visual/preceptual remediation/compensation, DME instructions, Cryotherapy, and Moist heat.  All performed as medically  necessary.  All included unless contraindicated  PLAN FOR NEXT SESSION: review HEP PRN, quad/hamstring strengthening, postural/shoulder exercises   Corean JULIANNA Ku, PT, DPT 04/28/24 8:00 AM   Date of referral: 03/28/2024 Referring provider: Jerri Kay HERO, MD Referring diagnosis? M25.511,G89.29 (ICD-10-CM) - Chronic right shoulder pain M25.562,G89.29 (ICD-10-CM) - Chronic pain of left knee Treatment diagnosis? (if different than referring diagnosis) same  What was this (referring dx) caused by? Ongoing Issue and Arthritis  Lysle of Condition: Chronic (continuous duration > 3 months)   Laterality: Both  Current Functional Measure Score: Patient Specific Functional Scale 4/10 avg today  Objective measurements identify impairments when they are compared to normal values, the uninvolved extremity, and prior level of function.  [x]  Yes  []  No  Objective assessment of functional ability: Moderate functional limitations   Briefly describe symptoms: Pt indicated history of scope on Lt knee years ago with complaints of continued pain with crackling/popping noise.  Does reported some trouble with both but Lt > Rt.  Reported trouble with stairs going up more than down with some giving way.  Reported full bending trouble as well.   Rt shoulder indicated to possibly be connected to fall while mowing several years ago.  Reported increased trouble for some time but recovered some but not completely improved.  Limiting in motion and lifting due to Rt shoulder symptoms.  Limited in workout routine.   Pt indicated sleeping can impact sleep at times.   How did symptoms start: Insidious onset  Average pain intensity:  Last  24 hours: up to 8-9/10  Past week: up to 8-9/10  How often does the pt experience symptoms? Frequently  How much have the symptoms interfered with usual daily activities? Quite a bit  How has condition changed since care began at this facility? NA - initial visit  In general, how is the patients overall health? Good   BACK PAIN (STarT Back Screening Tool) No

## 2024-05-02 ENCOUNTER — Ambulatory Visit: Admitting: Rehabilitative and Restorative Service Providers"

## 2024-05-02 ENCOUNTER — Encounter: Payer: Self-pay | Admitting: Rehabilitative and Restorative Service Providers"

## 2024-05-02 DIAGNOSIS — R262 Difficulty in walking, not elsewhere classified: Secondary | ICD-10-CM | POA: Diagnosis not present

## 2024-05-02 DIAGNOSIS — M25511 Pain in right shoulder: Secondary | ICD-10-CM | POA: Diagnosis not present

## 2024-05-02 DIAGNOSIS — M25562 Pain in left knee: Secondary | ICD-10-CM

## 2024-05-02 DIAGNOSIS — G8929 Other chronic pain: Secondary | ICD-10-CM

## 2024-05-02 DIAGNOSIS — M6281 Muscle weakness (generalized): Secondary | ICD-10-CM | POA: Diagnosis not present

## 2024-05-02 NOTE — Therapy (Signed)
 OUTPATIENT PHYSICAL THERAPY TREATMENT   Patient Name: Jeremy Estrada MRN: [id number] DOB:[birth year], 63 y.o., male Today's Date: 05/02/2024  END OF SESSION:  PT End of Session - 05/02/24 1012     Visit Number 3    Number of Visits 20    Date for PT Re-Evaluation 06/23/24    Authorization Type [employer] $20 copay    Progress Note Due on Visit 10    PT Start Time 1012    PT Stop Time 1052    PT Time Calculation (min) 40 min    Activity Tolerance Patient limited by pain    Behavior During Therapy [facility] for tasks assessed/performed            History reviewed. No pertinent past medical history. Past Surgical History:  Procedure Laterality Date   COLONOSCOPY WITH PROPOFOL  N/A 12/19/2016   Procedure: COLONOSCOPY WITH PROPOFOL ;  Surgeon: Gladis MARLA Louder, MD;  Location: [facility];  Service: Endoscopy;  Laterality: N/A;   KNEE ARTHROSCOPY Left    There are no active problems to display for this patient.   PCP: Rexanne Ingle MD  REFERRING PROVIDER: Jerri Kay HERO, MD  REFERRING DIAG: [phone] (ICD-10-CM) - Chronic right shoulder pain M25.562,G89.29 (ICD-10-CM) - Chronic pain of left knee  THERAPY DIAG:  Chronic pain of left knee  Chronic right shoulder pain  Difficulty in walking, not elsewhere classified  Muscle weakness (generalized)  Rationale for Evaluation and Treatment: Rehabilitation  ONSET DATE: Chronic complaints, worsened last 1-2 years  SUBJECTIVE:   SUBJECTIVE STATEMENT: Pt indicated a little sore after last visit in knee but better after a day or so.  Pt indicated shoulder about the same as before.  Pt indicated knee overall a tiny bit better from first day.  Reported stairs haven't had that much pain.   PERTINENT HISTORY: See above  PAIN:  NPRS scale: mild symptoms Pain location: Rt shoulder, Lt knee > Rt Pain description: knee: sharp at times, everyday achy Aggravating factors: knee: full bending, WB pressure, going up stairs  ,  shoulder:  throwing motion, overhead lifting/reaching.  Relieving factors: ibuprofen for shoulder  PRECAUTIONS: None  WEIGHT BEARING RESTRICTIONS: No  FALLS:  Has patient fallen in last 6 months? No  LIVING ENVIRONMENT: Lives in: House/apartment Stairs: stairs but not to bedroom.   OCCUPATION: Retired   PLOF: Independent, previously used gym until the last few months, yardwork  PATIENT GOALS: Reduce pain  OBJECTIVE:   IMAGING 03/28/2024 xray knee: X-rays of the left knee show advanced tricompartmental osteoarthritis with  significant joint space narrowing and osteophytic changes.   03/28/2024 xray shoulder X-rays of the right shoulder show age-appropriate degenerative changes.   No structural abnormalities.  No acute abnormalities.   PATIENT SURVEYS:  Patient-Specific Activity Scoring Scheme  0 represents "unable to perform." 10 represents "able to perform at prior level. 0 1 2 3 4 5 6 7 8 9  10 (Date and Score)   Activity Eval  04/14/2024    1. Stairs with weight  6    2. Getting off   2    3. Run 6   4. Lifting things away from body 2   5.    Score 4    Total score = sum of the activity scores/number of activities Minimum detectable change (90%CI) for average score = 2 points Minimum detectable change (90%CI) for single activity score = 3 points  COGNITION: 04/14/2024 Overall cognitive status: WFL    SENSATION: 04/14/2024 No specific testing today  EDEMA:  04/14/2024 No specific joint edema observed/measured today  MUSCLE LENGTH: 04/14/2024 No specific testing today  POSTURE:  04/14/2024 Rounded shoulders bilateral   PALPATION 04/14/2024 Trigger points in Rt infraspinatus, Rt upper trap  UPPER EXTREMITY ROM:  ROM Right Eval 04/14/2024 Left Eval 04/14/2024  Shoulder flexion [facility]  Shoulder extension    Shoulder abduction [facility]  Shoulder adduction    Shoulder extension    Shoulder internal rotation 62 in 75 deg abduction supine AROM    Shoulder external rotation 80 in 75 deg abduction supine AROM    (Blank rows = not tested)   UPPER EXTREMITY MMT:  MMT Right Eval 04/14/2024 Left Eval 04/14/2024  Shoulder flexion 5/5 5/5  Shoulder extension    Shoulder abduction 5/5 5/5  Shoulder adduction    Shoulder extension    Shoulder internal rotation 5/5 5/5  Shoulder external rotation 3/5 5/5   (Blank rows = not tested)   LOWER EXTREMITY ROM:   ROM Right Eval 04/14/2024 Left Eval 04/14/2024  Hip flexion    Hip extension    Hip abduction    Hip adduction    Hip internal rotation    Hip external rotation    Knee flexion 115 c pain 105 c pain  Knee extension    Ankle dorsiflexion    Ankle plantarflexion    Ankle inversion    Ankle eversion     (Blank rows = not tested)  LOWER EXTREMITY MMT:  MMT Right Eval 04/14/2024 Left Eval 04/14/2024 Left 05/02/2024  Hip flexion 5/5 5/5   Knee flexion 5/5 5/5   Knee extension 5/5 82, 84 lbs 4/5 40, 37 lbs  50, 47.4 lbs  Ankle dorsiflexion 5/5 5/5    (Blank rows = not tested)  SPECIAL TESTS:  04/14/2024 (-) Painful arc, drop arm, empty can, lift off on Rt shoulder.  (+) ER lag noted  FUNCTIONAL TESTS:  04/14/2024 18 inch chair transfer: able on 1st attempt with deviation to Rt leg WB Lt SLS: < 5 seconds Rt SLS: < 5 seconds  GAIT: 04/14/2024 Independent ambulation on level surfaces without noted changes.                                                                                                                                                                         TODAY'S TREATMENT     DATE:  05/02/2024 Therex: UBE LE only lvl 5.0 5 mins, UE only reverse lvl 3.0 4 mins Seated quad set with SLR 2 x 10 bilateral with review for home.    TherActivity (to improve stairs, transfers, stairs) Leg press double leg 93 lbs x 15, leg press 37 lbs x 15 bilateral    Neuro Re-ed (scapular  control, muscle activation) Standing blue band rows c scapular  retraction focus 2 x 15  Standing bilateral gh ext blue band x 10  Blue band ER walk out isometric with towel under arm 5 sec hold x 5 each      TODAY'S TREATMENT     DATE: 04/21/24 TherEx NuStep L6 x 10 min Scapular retraction 10x5 sec hol Crossbody posterior capsule stretch 5x15 sec Seated Lt quad sets 2x10; 5 sec hold Seated Lt SLR 2x10; 3 sec hold with cues to decrease ext lag Rt shoulder ER isometric 20x5 sec hold Rows L4 band 2x10; 5 sec hold   TODAY'S TREATMENT     DATE: 04/14/2024 Therex:    HEP instruction/performance c cues for techniques, handout provided.  Trial set performed of each for comprehension and symptom assessment.  See below for exercise list  Self Care Education on gym activity with pain montioring scale focus for adapting or withhold activity (focused on keeping low   PATIENT EDUCATION:  05/02/2024 Education details: HEP update Person educated: Patient Education method: Programmer, multimedia, Demonstration, Verbal cues, and Handouts Education comprehension: verbalized understanding, returned demonstration, and verbal cues required  HOME EXERCISE PROGRAM: Access Code: [email or web address] URL: [email or web address] Date: 05/02/2024 Prepared by: Ozell Silvan  Exercises - Seated Scapular Retraction  - 3-5 x daily - 7 x weekly - 1 sets - 10 reps - 3-5 hold - Standing Shoulder Posterior Capsule Stretch (Mirrored)  - 2 x daily - 7 x weekly - 1 sets - 5 reps - 15 hold - Standing Isometric Shoulder External Rotation with Doorway (Mirrored)  - 1-2 x daily - 7 x weekly - 1 sets - 6-8 reps - 10 hold - Seated Quad Set (Mirrored)  - 3-5 x daily - 7 x weekly - 1 sets - 10 reps - 5 hold - Seated Straight Leg Heel Taps  - 1-2 x daily - 7 x weekly - 3 sets - 10 reps - Standing Bilateral Low Shoulder Row with Anchored Resistance  - 1 x daily - 7 x weekly - 2-3 sets - 10-15 reps - Shoulder Extension with Resistance  - 1 x daily - 7 x weekly - 1-2 sets - 10-15 reps -  Shoulder External Rotation Reactive Isometrics  - 1 x daily - 7 x weekly - 1 sets - 10 reps - 5-15 hold  ASSESSMENT:  CLINICAL IMPRESSION: Encouraged increased frequency/consistency for HEP to continue to address impairments.  Reviewed leg press activity for gym use with adaptations to avoid pain increases in activity.  Continued skilled PT services indicated at this time.   OBJECTIVE IMPAIRMENTS: decreased activity tolerance, decreased balance, decreased coordination, decreased endurance, decreased mobility, difficulty walking, decreased ROM, decreased strength, hypomobility, increased fascial restrictions, impaired perceived functional ability, increased muscle spasms, impaired flexibility, impaired UE functional use, improper body mechanics, postural dysfunction, and pain.   ACTIVITY LIMITATIONS: carrying, lifting, bending, standing, squatting, sleeping, stairs, transfers, reach over head, and locomotion level  PARTICIPATION LIMITATIONS: meal prep, cleaning, laundry, interpersonal relationship, community activity, yard work, and exercise routine  PERSONAL FACTORS: Time since onset of injury/illness/exacerbation and multiple body parts are also affecting patient's functional outcome.   REHAB POTENTIAL: Good  CLINICAL DECISION MAKING: Evolving/moderate complexity  EVALUATION COMPLEXITY: Moderate   GOALS: Goals reviewed with patient? Yes  SHORT TERM GOALS: (target date for Short term goals are 3 weeks 05/05/2024)   1.  Patient will demonstrate independent use of home exercise program to maintain progress from in clinic treatments. Goal status: partially met.  LONG TERM GOALS: (target dates for all long term goals are 10 weeks  06/23/2024 )   1. Patient will demonstrate/report pain at worst less than or equal to 2/10 to facilitate minimal limitation in daily activity secondary to pain symptoms. Goal status: New   2. Patient will demonstrate independent use of home exercise program to  facilitate ability to maintain/progress functional gains from skilled physical therapy services. Goal status: New   3. Patient will demonstrate Patient specific functional scale avg > or = 8/10 to indicate reduced disability due to condition.   Goal status: New   4.  Patient will demonstrate bilateral  LE MMT 5/5, Lt knee extension dynamometry with 15% of Rt to faciltiate usual transfers, stairs, squatting at PLOF for daily life.   Goal status: New   5.  Patient will demonstrate Lt shoulder MMT 5/5 throughout to facilitate usual reaching, lifting, carrying at PLOF s limitation.  Goal status: New   6.  Patient will demonstrate ascending/descending stairs reciprocal gait pattern.  Goal status: New   7.  Patient will demonstrate bilateral SLS > 10 seconds to facilitate stability in ambulation.  Goal Status: New   PLAN:  PT FREQUENCY: 1-2x/week  PT DURATION: 10 weeks  PLANNED INTERVENTIONS: Can include 02853- PT Re-evaluation, 97110-Therapeutic exercises, 97530- Therapeutic activity, [id number]- Neuromuscular re-education, 97535- Self Care, 97140- Manual therapy, [phone]- Gait training, [phone]- Orthotic Fit/training, [phone]- Canalith repositioning, [id number]- Aquatic Therapy, [phone]- Electrical stimulation (unattended), [id number] Physical performance testing, 97016- Vasopneumatic device, [id number]- Ultrasound, [id number]- Traction (mechanical), [id number]- Ionotophoresis 4mg /ml Dexamethasone,  20560 - Needle insertion w/o injection 1 or 2 muscles, 20561 - Needle insertion w/o injection 3 or more muscles.   Patient/Family education, Balance training, Stair training, Taping, Dry Needling, Joint mobilization, Joint manipulation, Spinal manipulation, Spinal mobilization, Scar mobilization, Vestibular training, Visual/preceptual remediation/compensation, DME instructions, Cryotherapy, and Moist heat.  All performed as medically necessary.  All included unless contraindicated  PLAN FOR NEXT SESSION: Progressive strengthening  as able for UE/LE  Ozell Silvan, PT, DPT, OCS, ATC 05/02/24  10:59 AM     Date of referral: 03/28/2024 Referring provider: Jerri Kay HERO, MD Referring diagnosis? M25.511,G89.29 (ICD-10-CM) - Chronic right shoulder pain M25.562,G89.29 (ICD-10-CM) - Chronic pain of left knee Treatment diagnosis? (if different than referring diagnosis) same  What was this (referring dx) caused by? Ongoing Issue and Arthritis  Lysle of Condition: Chronic (continuous duration > 3 months)   Laterality: Both  Current Functional Measure Score: Patient Specific Functional Scale 4/10 avg today  Objective measurements identify impairments when they are compared to normal values, the uninvolved extremity, and prior level of function.  [x]  Yes  []  No  Objective assessment of functional ability: Moderate functional limitations   Briefly describe symptoms: Pt indicated history of scope on Lt knee years ago with complaints of continued pain with crackling/popping noise.  Does reported some trouble with both but Lt > Rt.  Reported trouble with stairs going up more than down with some giving way.  Reported full bending trouble as well.   Rt shoulder indicated to possibly be connected to fall while mowing several years ago.  Reported increased trouble for some time but recovered some but not completely improved.  Limiting in motion and lifting due to Rt shoulder symptoms.  Limited in workout routine.   Pt indicated sleeping can impact sleep at times.   How did symptoms start: Insidious onset  Average pain intensity:  Last 24 hours: up to 8-9/10  Past week: up to 8-9/10  How often does the pt experience symptoms? Frequently  How much have the symptoms interfered with usual daily activities? Quite a bit  How has condition changed since care began at this facility? NA - initial visit  In general, how is the patients overall health? Good   BACK PAIN (STarT Back Screening Tool) No

## 2024-05-05 ENCOUNTER — Ambulatory Visit: Admitting: Rehabilitative and Restorative Service Providers"

## 2024-05-05 ENCOUNTER — Encounter: Payer: Self-pay | Admitting: Rehabilitative and Restorative Service Providers"

## 2024-05-05 DIAGNOSIS — G8929 Other chronic pain: Secondary | ICD-10-CM | POA: Diagnosis not present

## 2024-05-05 DIAGNOSIS — M6281 Muscle weakness (generalized): Secondary | ICD-10-CM

## 2024-05-05 DIAGNOSIS — M25562 Pain in left knee: Secondary | ICD-10-CM

## 2024-05-05 DIAGNOSIS — R262 Difficulty in walking, not elsewhere classified: Secondary | ICD-10-CM

## 2024-05-05 DIAGNOSIS — M25511 Pain in right shoulder: Secondary | ICD-10-CM

## 2024-05-05 NOTE — Therapy (Signed)
 OUTPATIENT PHYSICAL THERAPY TREATMENT   Patient Name: Jeremy Estrada MRN: [id number] DOB:1961-10-17, 63 y.o., male Today's Date: 05/05/2024  END OF SESSION:  PT End of Session - 05/05/24 0849     Visit Number 4    Number of Visits 20    Date for PT Re-Evaluation 06/23/24    Authorization Type [employer] $20 copay    Progress Note Due on Visit 10    PT Start Time 0848    PT Stop Time 0928    PT Time Calculation (min) 40 min    Activity Tolerance Patient tolerated treatment well    Behavior During Therapy [facility] for tasks assessed/performed             History reviewed. No pertinent past medical history. Past Surgical History:  Procedure Laterality Date   COLONOSCOPY WITH PROPOFOL  N/A 12/19/2016   Procedure: COLONOSCOPY WITH PROPOFOL ;  Surgeon: Gladis MARLA Louder, MD;  Location: [facility];  Service: Endoscopy;  Laterality: N/A;   KNEE ARTHROSCOPY Left    There are no active problems to display for this patient.   PCP: Rexanne Ingle MD  REFERRING PROVIDER: Jerri Kay HERO, MD  REFERRING DIAG: [phone] (ICD-10-CM) - Chronic right shoulder pain M25.562,G89.29 (ICD-10-CM) - Chronic pain of left knee  THERAPY DIAG:  Chronic pain of left knee  Chronic right shoulder pain  Difficulty in walking, not elsewhere classified  Muscle weakness (generalized)  Rationale for Evaluation and Treatment: Rehabilitation  ONSET DATE: Chronic complaints, worsened last 1-2 years  SUBJECTIVE:   SUBJECTIVE STATEMENT: Pt indicated no specific pain upon arrival today.  Reported feeling slight ache in shoulder after last visit but not sharp pains.    PERTINENT HISTORY: See above  PAIN:  NPRS scale: no pain upon arrival.  Pain location: Rt shoulder, Lt knee > Rt Pain description: knee: sharp at times, everyday achy Aggravating factors: knee: full bending, WB pressure, going up stairs  , shoulder:  throwing motion, overhead lifting/reaching.  Relieving factors: ibuprofen for  shoulder  PRECAUTIONS: None  WEIGHT BEARING RESTRICTIONS: No  FALLS:  Has patient fallen in last 6 months? No  LIVING ENVIRONMENT: Lives in: House/apartment Stairs: stairs but not to bedroom.   OCCUPATION: Retired   PLOF: Independent, previously used gym until the last few months, yardwork  PATIENT GOALS: Reduce pain  OBJECTIVE:   IMAGING 03/28/2024 xray knee: X-rays of the left knee show advanced tricompartmental osteoarthritis with  significant joint space narrowing and osteophytic changes.   03/28/2024 xray shoulder X-rays of the right shoulder show age-appropriate degenerative changes.   No structural abnormalities.  No acute abnormalities.   PATIENT SURVEYS:  Patient-Specific Activity Scoring Scheme  0 represents "unable to perform." 10 represents "able to perform at prior level. 0 1 2 3 4 5 6 7 8 9  10 (Date and Score)   Activity Eval  04/14/2024    1. Stairs with weight  6    2. Getting off   2    3. Run 6   4. Lifting things away from body 2   5.    Score 4    Total score = sum of the activity scores/number of activities Minimum detectable change (90%CI) for average score = 2 points Minimum detectable change (90%CI) for single activity score = 3 points  COGNITION: 04/14/2024 Overall cognitive status: WFL    SENSATION: 04/14/2024 No specific testing today  EDEMA:  04/14/2024 No specific joint edema observed/measured today  MUSCLE LENGTH: 04/14/2024 No specific testing today  POSTURE:  04/14/2024 Rounded shoulders bilateral   PALPATION 04/14/2024 Trigger points in Rt infraspinatus, Rt upper trap  UPPER EXTREMITY ROM:  ROM Right Eval 04/14/2024 Left Eval 04/14/2024  Shoulder flexion [facility]  Shoulder extension    Shoulder abduction [facility]  Shoulder adduction    Shoulder extension    Shoulder internal rotation 62 in 75 deg abduction supine AROM   Shoulder external rotation 80 in 75 deg abduction supine AROM    (Blank rows = not  tested)   UPPER EXTREMITY MMT:  MMT Right Eval 04/14/2024 Left Eval 04/14/2024  Shoulder flexion 5/5 5/5  Shoulder extension    Shoulder abduction 5/5 5/5  Shoulder adduction    Shoulder extension    Shoulder internal rotation 5/5 5/5  Shoulder external rotation 3/5 5/5   (Blank rows = not tested)   LOWER EXTREMITY ROM:   ROM Right Eval 04/14/2024 Left Eval 04/14/2024  Hip flexion    Hip extension    Hip abduction    Hip adduction    Hip internal rotation    Hip external rotation    Knee flexion 115 c pain 105 c pain  Knee extension    Ankle dorsiflexion    Ankle plantarflexion    Ankle inversion    Ankle eversion     (Blank rows = not tested)  LOWER EXTREMITY MMT:  MMT Right Eval 04/14/2024 Left Eval 04/14/2024 Left 05/02/2024  Hip flexion 5/5 5/5   Knee flexion 5/5 5/5   Knee extension 5/5 82, 84 lbs 4/5 40, 37 lbs  50, 47.4 lbs  Ankle dorsiflexion 5/5 5/5    (Blank rows = not tested)  SPECIAL TESTS:  04/14/2024 (-) Painful arc, drop arm, empty can, lift off on Rt shoulder.  (+) ER lag noted  FUNCTIONAL TESTS:  04/14/2024 18 inch chair transfer: able on 1st attempt with deviation to Rt leg WB Lt SLS: < 5 seconds Rt SLS: < 5 seconds  GAIT: 04/14/2024 Independent ambulation on level surfaces without noted changes.                                                                                                                                                                         TODAY'S TREATMENT     DATE:  05/05/2024 Therex: Recumbent bike lvl 4 10 mins with intervals 15 seconds top of each minute from min 2 to min 8, - seat all the way back  TherActivity (to improve stairs, transfers, stairs) Leg press double leg 93 lbs x 20, leg press 43 lbs 2 x 15 bilateral  Lateral step down slow lowering focus 4 inch step x 15 bilateral    Neuro Re-ed (scapular control, muscle activation) Standing blue band rows c scapular  retraction focus 2 x 15   Standing bilateral gh ext blue band 2 x 15  Blue band ER walk out isometric with towel under arm 5 sec hold x 10  Rt shoulder   TODAY'S TREATMENT     DATE:  05/02/2024 Therex: UBE LE only lvl 5.0 5 mins, UE only reverse lvl 3.0 4 mins Seated quad set with SLR 2 x 10 bilateral with review for home.    TherActivity (to improve stairs, transfers, stairs) Leg press double leg 93 lbs x 15, leg press 37 lbs x 15 bilateral    Neuro Re-ed (scapular control, muscle activation) Standing blue band rows c scapular retraction focus 2 x 15  Standing bilateral gh ext blue band x 10  Blue band ER walk out isometric with towel under arm 5 sec hold x 5 each      TODAY'S TREATMENT     DATE: 04/21/24 TherEx NuStep L6 x 10 min Scapular retraction 10x5 sec hol Crossbody posterior capsule stretch 5x15 sec Seated Lt quad sets 2x10; 5 sec hold Seated Lt SLR 2x10; 3 sec hold with cues to decrease ext lag Rt shoulder ER isometric 20x5 sec hold Rows L4 band 2x10; 5 sec hold   TODAY'S TREATMENT     DATE: 04/14/2024 Therex:    HEP instruction/performance c cues for techniques, handout provided.  Trial set performed of each for comprehension and symptom assessment.  See below for exercise list  Self Care Education on gym activity with pain montioring scale focus for adapting or withhold activity (focused on keeping low   PATIENT EDUCATION:  05/02/2024 Education details: HEP update Person educated: Patient Education method: Programmer, multimedia, Demonstration, Verbal cues, and Handouts Education comprehension: verbalized understanding, returned demonstration, and verbal cues required  HOME EXERCISE PROGRAM: Access Code: [email or web address] URL: [email or web address] Date: 05/02/2024 Prepared by: Ozell Silvan  Exercises - Seated Scapular Retraction  - 3-5 x daily - 7 x weekly - 1 sets - 10 reps - 3-5 hold - Standing Shoulder Posterior Capsule Stretch (Mirrored)  - 2 x daily - 7 x weekly - 1 sets - 5  reps - 15 hold - Standing Isometric Shoulder External Rotation with Doorway (Mirrored)  - 1-2 x daily - 7 x weekly - 1 sets - 6-8 reps - 10 hold - Seated Quad Set (Mirrored)  - 3-5 x daily - 7 x weekly - 1 sets - 10 reps - 5 hold - Seated Straight Leg Heel Taps  - 1-2 x daily - 7 x weekly - 3 sets - 10 reps - Standing Bilateral Low Shoulder Row with Anchored Resistance  - 1 x daily - 7 x weekly - 2-3 sets - 10-15 reps - Shoulder Extension with Resistance  - 1 x daily - 7 x weekly - 1-2 sets - 10-15 reps - Shoulder External Rotation Reactive Isometrics  - 1 x daily - 7 x weekly - 1 sets - 10 reps - 5-15 hold  ASSESSMENT:  CLINICAL IMPRESSION: Rt shoulder strength still lacking compared to Lt and impactful for Rt arm use in daily life.  Continued skilled PT services to address strength deficits in UE/LE to progress function.    OBJECTIVE IMPAIRMENTS: decreased activity tolerance, decreased balance, decreased coordination, decreased endurance, decreased mobility, difficulty walking, decreased ROM, decreased strength, hypomobility, increased fascial restrictions, impaired perceived functional ability, increased muscle spasms, impaired flexibility, impaired UE functional use, improper body mechanics, postural dysfunction, and pain.   ACTIVITY LIMITATIONS: carrying, lifting, bending, standing, squatting, sleeping, stairs, transfers, reach over  head, and locomotion level  PARTICIPATION LIMITATIONS: meal prep, cleaning, laundry, interpersonal relationship, community activity, yard work, and exercise routine  PERSONAL FACTORS: Time since onset of injury/illness/exacerbation and multiple body parts are also affecting patient's functional outcome.   REHAB POTENTIAL: Good  CLINICAL DECISION MAKING: Evolving/moderate complexity  EVALUATION COMPLEXITY: Moderate   GOALS: Goals reviewed with patient? Yes  SHORT TERM GOALS: (target date for Short term goals are 3 weeks 05/05/2024)   1.  Patient will  demonstrate independent use of home exercise program to maintain progress from in clinic treatments. Goal status: partially met.   LONG TERM GOALS: (target dates for all long term goals are 10 weeks  06/23/2024 )   1. Patient will demonstrate/report pain at worst less than or equal to 2/10 to facilitate minimal limitation in daily activity secondary to pain symptoms. Goal status: on going 05/05/2024   2. Patient will demonstrate independent use of home exercise program to facilitate ability to maintain/progress functional gains from skilled physical therapy services. Goal status:  on going 05/05/2024   3. Patient will demonstrate Patient specific functional scale avg > or = 8/10 to indicate reduced disability due to condition.   Goal status:  on going 05/05/2024   4.  Patient will demonstrate bilateral  LE MMT 5/5, Lt knee extension dynamometry with 15% of Rt to faciltiate usual transfers, stairs, squatting at PLOF for daily life.   Goal status:  on going 05/05/2024   5.  Patient will demonstrate Lt shoulder MMT 5/5 throughout to facilitate usual reaching, lifting, carrying at PLOF s limitation.  Goal status:  on going 05/05/2024   6.  Patient will demonstrate ascending/descending stairs reciprocal gait pattern.  Goal status:  on going 05/05/2024   7.  Patient will demonstrate bilateral SLS > 10 seconds to facilitate stability in ambulation.  Goal Status:  on going 05/05/2024   PLAN:  PT FREQUENCY: 1-2x/week  PT DURATION: 10 weeks  PLANNED INTERVENTIONS: Can include 02853- PT Re-evaluation, 97110-Therapeutic exercises, 97530- Therapeutic activity, 97112- Neuromuscular re-education, 97535- Self Care, 97140- Manual therapy, [phone]- Gait training, [phone]- Orthotic Fit/training, [phone]- Canalith repositioning, [id number]- Aquatic Therapy, [phone]- Electrical stimulation (unattended), [id number] Physical performance testing, 97016- Vasopneumatic device, [id number]- Ultrasound, [id number]- Traction (mechanical), [id number]-  Ionotophoresis 4mg /ml Dexamethasone,  20560 - Needle insertion w/o injection 1 or 2 muscles, 20561 - Needle insertion w/o injection 3 or more muscles.   Patient/Family education, Balance training, Stair training, Taping, Dry Needling, Joint mobilization, Joint manipulation, Spinal manipulation, Spinal mobilization, Scar mobilization, Vestibular training, Visual/preceptual remediation/compensation, DME instructions, Cryotherapy, and Moist heat.  All performed as medically necessary.  All included unless contraindicated  PLAN FOR NEXT SESSION: Continue strengthening.   Ozell Silvan, PT, DPT, OCS, ATC 05/05/24  9:31 AM     Date of referral: 03/28/2024 Referring provider: Jerri Kay HERO, MD Referring diagnosis? M25.511,G89.29 (ICD-10-CM) - Chronic right shoulder pain M25.562,G89.29 (ICD-10-CM) - Chronic pain of left knee Treatment diagnosis? (if different than referring diagnosis) same  What was this (referring dx) caused by? Ongoing Issue and Arthritis  Lysle of Condition: Chronic (continuous duration > 3 months)   Laterality: Both  Current Functional Measure Score: Patient Specific Functional Scale 4/10 avg today  Objective measurements identify impairments when they are compared to normal values, the uninvolved extremity, and prior level of function.  [x]  Yes  []  No  Objective assessment of functional ability: Moderate functional limitations   Briefly describe symptoms: Pt indicated history of scope on Lt knee years ago with complaints of continued  pain with crackling/popping noise.  Does reported some trouble with both but Lt > Rt.  Reported trouble with stairs going up more than down with some giving way.  Reported full bending trouble as well.   Rt shoulder indicated to possibly be connected to fall while mowing several years ago.  Reported increased trouble for some time but recovered some but not completely improved.  Limiting in motion and lifting due to Rt shoulder symptoms.   Limited in workout routine.   Pt indicated sleeping can impact sleep at times.   How did symptoms start: Insidious onset  Average pain intensity:  Last 24 hours: up to 8-9/10  Past week: up to 8-9/10  How often does the pt experience symptoms? Frequently  How much have the symptoms interfered with usual daily activities? Quite a bit  How has condition changed since care began at this facility? NA - initial visit  In general, how is the patients overall health? Good   BACK PAIN (STarT Back Screening Tool) No

## 2024-05-07 ENCOUNTER — Ambulatory Visit: Admitting: Rehabilitative and Restorative Service Providers"

## 2024-05-07 ENCOUNTER — Encounter: Payer: Self-pay | Admitting: Rehabilitative and Restorative Service Providers"

## 2024-05-07 DIAGNOSIS — R262 Difficulty in walking, not elsewhere classified: Secondary | ICD-10-CM | POA: Diagnosis not present

## 2024-05-07 DIAGNOSIS — M6281 Muscle weakness (generalized): Secondary | ICD-10-CM

## 2024-05-07 DIAGNOSIS — M25511 Pain in right shoulder: Secondary | ICD-10-CM | POA: Diagnosis not present

## 2024-05-07 DIAGNOSIS — G8929 Other chronic pain: Secondary | ICD-10-CM | POA: Diagnosis not present

## 2024-05-07 DIAGNOSIS — M25562 Pain in left knee: Secondary | ICD-10-CM

## 2024-05-07 NOTE — Therapy (Signed)
 OUTPATIENT PHYSICAL THERAPY TREATMENT   Patient Name: Jeremy Estrada MRN: [id number] DOB:Nov 29, 1960, 63 y.o., male Today's Date: 05/07/2024  END OF SESSION:  PT End of Session - 05/07/24 0841     Visit Number 5    Number of Visits 20    Date for PT Re-Evaluation 06/23/24    Authorization Type [employer] $20 copay    Progress Note Due on Visit 10    PT Start Time 0841    PT Stop Time 0921    PT Time Calculation (min) 40 min    Activity Tolerance Patient tolerated treatment well    Behavior During Therapy [facility] for tasks assessed/performed              History reviewed. No pertinent past medical history. Past Surgical History:  Procedure Laterality Date   COLONOSCOPY WITH PROPOFOL  N/A 12/19/2016   Procedure: COLONOSCOPY WITH PROPOFOL ;  Surgeon: Gladis MARLA Louder, MD;  Location: [facility];  Service: Endoscopy;  Laterality: N/A;   KNEE ARTHROSCOPY Left    There are no active problems to display for this patient.   PCP: Rexanne Ingle MD  REFERRING PROVIDER: Jerri Kay HERO, MD  REFERRING DIAG: [phone] (ICD-10-CM) - Chronic right shoulder pain M25.562,G89.29 (ICD-10-CM) - Chronic pain of left knee  THERAPY DIAG:  Chronic pain of left knee  Chronic right shoulder pain  Difficulty in walking, not elsewhere classified  Muscle weakness (generalized)  Rationale for Evaluation and Treatment: Rehabilitation  ONSET DATE: Chronic complaints, worsened last 1-2 years  SUBJECTIVE:   SUBJECTIVE STATEMENT: Pt indicated having some light soreness in knees yesterday but better today.  Reported occasional shoulder complaints but not constant.   PERTINENT HISTORY: See above  PAIN:  NPRS scale: at most for knee last 2 days: 5/10, at most for shoulder last 2 days 4/10 Pain location: Rt shoulder, Lt knee > Rt Pain description: knee: sharp at times, everyday achy Aggravating factors: knee: full bending, WB pressure, going up stairs  , shoulder:  throwing motion, overhead  lifting/reaching.  Relieving factors: ibuprofen for shoulder  PRECAUTIONS: None  WEIGHT BEARING RESTRICTIONS: No  FALLS:  Has patient fallen in last 6 months? No  LIVING ENVIRONMENT: Lives in: House/apartment Stairs: stairs but not to bedroom.   OCCUPATION: Retired   PLOF: Independent, previously used gym until the last few months, yardwork  PATIENT GOALS: Reduce pain  OBJECTIVE:   IMAGING 03/28/2024 xray knee: X-rays of the left knee show advanced tricompartmental osteoarthritis with  significant joint space narrowing and osteophytic changes.   03/28/2024 xray shoulder X-rays of the right shoulder show age-appropriate degenerative changes.   No structural abnormalities.  No acute abnormalities.   PATIENT SURVEYS:  Patient-Specific Activity Scoring Scheme  0 represents "unable to perform." 10 represents "able to perform at prior level. 0 1 2 3 4 5 6 7 8 9  10 (Date and Score)   Activity Eval  04/14/2024    1. Stairs with weight  6    2. Getting off   2    3. Run 6   4. Lifting things away from body 2   5.    Score 4    Total score = sum of the activity scores/number of activities Minimum detectable change (90%CI) for average score = 2 points Minimum detectable change (90%CI) for single activity score = 3 points  COGNITION: 04/14/2024 Overall cognitive status: WFL    SENSATION: 04/14/2024 No specific testing today  EDEMA:  04/14/2024 No specific joint edema observed/measured today  MUSCLE LENGTH: 04/14/2024 No specific testing today  POSTURE:  04/14/2024 Rounded shoulders bilateral   PALPATION 04/14/2024 Trigger points in Rt infraspinatus, Rt upper trap  UPPER EXTREMITY ROM:  ROM Right Eval 04/14/2024 Left Eval 04/14/2024  Shoulder flexion [facility]  Shoulder extension    Shoulder abduction [facility]  Shoulder adduction    Shoulder extension    Shoulder internal rotation 62 in 75 deg abduction supine AROM   Shoulder external rotation 80 in 75  deg abduction supine AROM    (Blank rows = not tested)   UPPER EXTREMITY MMT:  MMT Right Eval 04/14/2024 Left Eval 04/14/2024  Shoulder flexion 5/5 5/5  Shoulder extension    Shoulder abduction 5/5 5/5  Shoulder adduction    Shoulder extension    Shoulder internal rotation 5/5 5/5  Shoulder external rotation 3/5 5/5   (Blank rows = not tested)   LOWER EXTREMITY ROM:   ROM Right Eval 04/14/2024 Left Eval 04/14/2024  Hip flexion    Hip extension    Hip abduction    Hip adduction    Hip internal rotation    Hip external rotation    Knee flexion 115 c pain 105 c pain  Knee extension    Ankle dorsiflexion    Ankle plantarflexion    Ankle inversion    Ankle eversion     (Blank rows = not tested)  LOWER EXTREMITY MMT:  MMT Right Eval 04/14/2024 Left Eval 04/14/2024 Left 05/02/2024  Hip flexion 5/5 5/5   Knee flexion 5/5 5/5   Knee extension 5/5 82, 84 lbs 4/5 40, 37 lbs  50, 47.4 lbs  Ankle dorsiflexion 5/5 5/5    (Blank rows = not tested)  SPECIAL TESTS:  04/14/2024 (-) Painful arc, drop arm, empty can, lift off on Rt shoulder.  (+) ER lag noted  FUNCTIONAL TESTS:  04/14/2024 18 inch chair transfer: able on 1st attempt with deviation to Rt leg WB Lt SLS: < 5 seconds Rt SLS: < 5 seconds  GAIT: 04/14/2024 Independent ambulation on level surfaces without noted changes.                                                                                                                                                                         TODAY'S TREATMENT     DATE:  05/07/2024 Therex: UBE LE only 8 mins lvl 4.0 with 15 second interval faster at top of each minute   - seat 17 UBE UE only 2 mins fwd/2 mins backward lvl 3.0  Knee machine double leg up single leg lowering eccentrics 10 lbs x 10 bilateral (small amount performed to check response - some knee joint popping noted in activity )   Neuro Re-ed (scapular  control, muscle activation) Standing blue band  rows c scapular retraction focus 2 x 15  Standing bilateral gh ext blue band 2 x 15  Green band ER walk out isometric with towel under arm 5 sec hold x 10  Rt shoulder  Standing TKE in small physio ball  5 sec hold x 10 , performed bilaterally   TODAY'S TREATMENT     DATE:  05/05/2024 Therex: Recumbent bike lvl 4 10 mins with intervals 15 seconds top of each minute from min 2 to min 8, - seat all the way back  TherActivity (to improve stairs, transfers, stairs) Leg press double leg 93 lbs x 20, leg press 43 lbs 2 x 15 bilateral  Lateral step down slow lowering focus 4 inch step x 15 bilateral    Neuro Re-ed (scapular control, muscle activation) Standing blue band rows c scapular retraction focus 2 x 15  Standing bilateral gh ext blue band 2 x 15  Blue band ER walk out isometric with towel under arm 5 sec hold x 10  Rt shoulder   TODAY'S TREATMENT     DATE:  05/02/2024 Therex: UBE LE only lvl 5.0 5 mins, UE only reverse lvl 3.0 4 mins Seated quad set with SLR 2 x 10 bilateral with review for home.    TherActivity (to improve stairs, transfers, stairs) Leg press double leg 93 lbs x 15, leg press 37 lbs x 15 bilateral    Neuro Re-ed (scapular control, muscle activation) Standing blue band rows c scapular retraction focus 2 x 15  Standing bilateral gh ext blue band x 10  Blue band ER walk out isometric with towel under arm 5 sec hold x 5 each    TODAY'S TREATMENT     DATE: 04/21/24 TherEx NuStep L6 x 10 min Scapular retraction 10x5 sec hol Crossbody posterior capsule stretch 5x15 sec Seated Lt quad sets 2x10; 5 sec hold Seated Lt SLR 2x10; 3 sec hold with cues to decrease ext lag Rt shoulder ER isometric 20x5 sec hold Rows L4 band 2x10; 5 sec hold   PATIENT EDUCATION:  05/02/2024 Education details: HEP update Person educated: Patient Education method: Programmer, multimedia, Demonstration, Verbal cues, and Handouts Education comprehension: verbalized understanding, returned  demonstration, and verbal cues required  HOME EXERCISE PROGRAM: Access Code: [email or web address] URL: [email or web address] Date: 05/02/2024 Prepared by: Ozell Silvan  Exercises - Seated Scapular Retraction  - 3-5 x daily - 7 x weekly - 1 sets - 10 reps - 3-5 hold - Standing Shoulder Posterior Capsule Stretch (Mirrored)  - 2 x daily - 7 x weekly - 1 sets - 5 reps - 15 hold - Standing Isometric Shoulder External Rotation with Doorway (Mirrored)  - 1-2 x daily - 7 x weekly - 1 sets - 6-8 reps - 10 hold - Seated Quad Set (Mirrored)  - 3-5 x daily - 7 x weekly - 1 sets - 10 reps - 5 hold - Seated Straight Leg Heel Taps  - 1-2 x daily - 7 x weekly - 3 sets - 10 reps - Standing Bilateral Low Shoulder Row with Anchored Resistance  - 1 x daily - 7 x weekly - 2-3 sets - 10-15 reps - Shoulder Extension with Resistance  - 1 x daily - 7 x weekly - 1-2 sets - 10-15 reps - Shoulder External Rotation Reactive Isometrics  - 1 x daily - 7 x weekly - 1 sets - 10 reps - 5-15 hold  ASSESSMENT:  CLINICAL IMPRESSION: Trial of knee extension machine  with no pain reported but some knee popping noted on Lt.  Will reassess response from today to decide on use going forward.  Continued progress as able for strengthening for quad and shoulder to help improve functional activity performance.  Continued skilled PT services warranted at this time.     OBJECTIVE IMPAIRMENTS: decreased activity tolerance, decreased balance, decreased coordination, decreased endurance, decreased mobility, difficulty walking, decreased ROM, decreased strength, hypomobility, increased fascial restrictions, impaired perceived functional ability, increased muscle spasms, impaired flexibility, impaired UE functional use, improper body mechanics, postural dysfunction, and pain.   ACTIVITY LIMITATIONS: carrying, lifting, bending, standing, squatting, sleeping, stairs, transfers, reach over head, and locomotion level  PARTICIPATION  LIMITATIONS: meal prep, cleaning, laundry, interpersonal relationship, community activity, yard work, and exercise routine  PERSONAL FACTORS: Time since onset of injury/illness/exacerbation and multiple body parts are also affecting patient's functional outcome.   REHAB POTENTIAL: Good  CLINICAL DECISION MAKING: Evolving/moderate complexity  EVALUATION COMPLEXITY: Moderate   GOALS: Goals reviewed with patient? Yes  SHORT TERM GOALS: (target date for Short term goals are 3 weeks 05/05/2024)   1.  Patient will demonstrate independent use of home exercise program to maintain progress from in clinic treatments. Goal status: partially met.   LONG TERM GOALS: (target dates for all long term goals are 10 weeks  06/23/2024 )   1. Patient will demonstrate/report pain at worst less than or equal to 2/10 to facilitate minimal limitation in daily activity secondary to pain symptoms. Goal status: on going 05/05/2024   2. Patient will demonstrate independent use of home exercise program to facilitate ability to maintain/progress functional gains from skilled physical therapy services. Goal status:  on going 05/05/2024   3. Patient will demonstrate Patient specific functional scale avg > or = 8/10 to indicate reduced disability due to condition.   Goal status:  on going 05/05/2024   4.  Patient will demonstrate bilateral  LE MMT 5/5, Lt knee extension dynamometry with 15% of Rt to faciltiate usual transfers, stairs, squatting at PLOF for daily life.   Goal status:  on going 05/05/2024   5.  Patient will demonstrate Lt shoulder MMT 5/5 throughout to facilitate usual reaching, lifting, carrying at PLOF s limitation.  Goal status:  on going 05/05/2024   6.  Patient will demonstrate ascending/descending stairs reciprocal gait pattern.  Goal status:  on going 05/05/2024   7.  Patient will demonstrate bilateral SLS > 10 seconds to facilitate stability in ambulation.  Goal Status:  on going  05/05/2024   PLAN:  PT FREQUENCY: 1-2x/week  PT DURATION: 10 weeks  PLANNED INTERVENTIONS: Can include 02853- PT Re-evaluation, 97110-Therapeutic exercises, 97530- Therapeutic activity, 97112- Neuromuscular re-education, 97535- Self Care, 97140- Manual therapy, [phone]- Gait training, [phone]- Orthotic Fit/training, [phone]- Canalith repositioning, [id number]- Aquatic Therapy, [phone]- Electrical stimulation (unattended), [id number] Physical performance testing, 97016- Vasopneumatic device, [id number]- Ultrasound, [id number]- Traction (mechanical), [id number]- Ionotophoresis 4mg /ml Dexamethasone,  20560 - Needle insertion w/o injection 1 or 2 muscles, 20561 - Needle insertion w/o injection 3 or more muscles.   Patient/Family education, Balance training, Stair training, Taping, Dry Needling, Joint mobilization, Joint manipulation, Spinal manipulation, Spinal mobilization, Scar mobilization, Vestibular training, Visual/preceptual remediation/compensation, DME instructions, Cryotherapy, and Moist heat.  All performed as medically necessary.  All included unless contraindicated  PLAN FOR NEXT SESSION: Continue strengthening as able.  Recheck response to knee extension machine.    Ozell Silvan, PT, DPT, OCS, ATC 05/07/24  9:20 AM     Date of referral: 03/28/2024 Referring provider: Jerri,  Kay HERO, MD Referring diagnosis? M25.511,G89.29 (ICD-10-CM) - Chronic right shoulder pain M25.562,G89.29 (ICD-10-CM) - Chronic pain of left knee Treatment diagnosis? (if different than referring diagnosis) same  What was this (referring dx) caused by? Ongoing Issue and Arthritis  Lysle of Condition: Chronic (continuous duration > 3 months)   Laterality: Both  Current Functional Measure Score: Patient Specific Functional Scale 4/10 avg today  Objective measurements identify impairments when they are compared to normal values, the uninvolved extremity, and prior level of function.  [x]  Yes  []  No  Objective assessment of functional  ability: Moderate functional limitations   Briefly describe symptoms: Pt indicated history of scope on Lt knee years ago with complaints of continued pain with crackling/popping noise.  Does reported some trouble with both but Lt > Rt.  Reported trouble with stairs going up more than down with some giving way.  Reported full bending trouble as well.   Rt shoulder indicated to possibly be connected to fall while mowing several years ago.  Reported increased trouble for some time but recovered some but not completely improved.  Limiting in motion and lifting due to Rt shoulder symptoms.  Limited in workout routine.   Pt indicated sleeping can impact sleep at times.   How did symptoms start: Insidious onset  Average pain intensity:  Last 24 hours: up to 8-9/10  Past week: up to 8-9/10  How often does the pt experience symptoms? Frequently  How much have the symptoms interfered with usual daily activities? Quite a bit  How has condition changed since care began at this facility? NA - initial visit  In general, how is the patients overall health? Good   BACK PAIN (STarT Back Screening Tool) No

## 2024-05-09 ENCOUNTER — Other Ambulatory Visit (HOSPITAL_COMMUNITY): Payer: Self-pay

## 2024-05-12 ENCOUNTER — Encounter: Payer: Self-pay | Admitting: Rehabilitative and Restorative Service Providers"

## 2024-05-12 ENCOUNTER — Ambulatory Visit: Admitting: Rehabilitative and Restorative Service Providers"

## 2024-05-12 DIAGNOSIS — M25562 Pain in left knee: Secondary | ICD-10-CM | POA: Diagnosis not present

## 2024-05-12 DIAGNOSIS — M25511 Pain in right shoulder: Secondary | ICD-10-CM

## 2024-05-12 DIAGNOSIS — G8929 Other chronic pain: Secondary | ICD-10-CM | POA: Diagnosis not present

## 2024-05-12 DIAGNOSIS — M6281 Muscle weakness (generalized): Secondary | ICD-10-CM | POA: Diagnosis not present

## 2024-05-12 DIAGNOSIS — R262 Difficulty in walking, not elsewhere classified: Secondary | ICD-10-CM

## 2024-05-12 NOTE — Therapy (Signed)
 OUTPATIENT PHYSICAL THERAPY TREATMENT   Patient Name: Jeremy Estrada MRN: [id number] DOB:1961/06/06, 63 y.o., male Today's Date: 05/12/2024  END OF SESSION:  PT End of Session - 05/12/24 0859     Visit Number 6    Number of Visits 20    Date for PT Re-Evaluation 06/23/24    Authorization Type [employer] $20 copay    Progress Note Due on Visit 10    PT Start Time 0849    PT Stop Time 0927    PT Time Calculation (min) 38 min    Activity Tolerance Patient tolerated treatment well    Behavior During Therapy [facility] for tasks assessed/performed               History reviewed. No pertinent past medical history. Past Surgical History:  Procedure Laterality Date   COLONOSCOPY WITH PROPOFOL  N/A 12/19/2016   Procedure: COLONOSCOPY WITH PROPOFOL ;  Surgeon: Gladis MARLA Louder, MD;  Location: [facility];  Service: Endoscopy;  Laterality: N/A;   KNEE ARTHROSCOPY Left    There are no active problems to display for this patient.   PCP: Rexanne Ingle MD  REFERRING PROVIDER: Jerri Kay HERO, MD  REFERRING DIAG: [phone] (ICD-10-CM) - Chronic right shoulder pain M25.562,G89.29 (ICD-10-CM) - Chronic pain of left knee  THERAPY DIAG:  Chronic pain of left knee  Chronic right shoulder pain  Difficulty in walking, not elsewhere classified  Muscle weakness (generalized)  Rationale for Evaluation and Treatment: Rehabilitation  ONSET DATE: Chronic complaints, worsened last 1-2 years  SUBJECTIVE:   SUBJECTIVE STATEMENT: Pt indicated feeling some soreness in knees.    PERTINENT HISTORY: See above  PAIN:  NPRS scale: at most for knee last 2 days: 5/10, at most for shoulder last 2 days 4/10 Pain location: Rt shoulder, Lt knee > Rt Pain description: knee: sharp at times, everyday achy Aggravating factors: knee: full bending, WB pressure, going up stairs  , shoulder:  throwing motion, overhead lifting/reaching.  Relieving factors: ibuprofen for shoulder  PRECAUTIONS:  None  WEIGHT BEARING RESTRICTIONS: No  FALLS:  Has patient fallen in last 6 months? No  LIVING ENVIRONMENT: Lives in: House/apartment Stairs: stairs but not to bedroom.   OCCUPATION: Retired   PLOF: Independent, previously used gym until the last few months, yardwork  PATIENT GOALS: Reduce pain  OBJECTIVE:   IMAGING 03/28/2024 xray knee: X-rays of the left knee show advanced tricompartmental osteoarthritis with  significant joint space narrowing and osteophytic changes.   03/28/2024 xray shoulder X-rays of the right shoulder show age-appropriate degenerative changes.   No structural abnormalities.  No acute abnormalities.   PATIENT SURVEYS:  Patient-Specific Activity Scoring Scheme  0 represents "unable to perform." 10 represents "able to perform at prior level. 0 1 2 3 4 5 6 7 8 9  10 (Date and Score)   Activity Eval  04/14/2024  05/12/2024  1. Stairs with weight  6  9  2. Getting off chair  2  7  3. Run 6 5  4. Lifting things away from body 2 2  5.    Score 4 5.75 avg    Total score = sum of the activity scores/number of activities Minimum detectable change (90%CI) for average score = 2 points Minimum detectable change (90%CI) for single activity score = 3 points  COGNITION: 04/14/2024 Overall cognitive status: WFL    SENSATION: 04/14/2024 No specific testing today  EDEMA:  04/14/2024 No specific joint edema observed/measured today  MUSCLE LENGTH: 04/14/2024 No specific testing today  POSTURE:  04/14/2024 Rounded shoulders bilateral   PALPATION 04/14/2024 Trigger points in Rt infraspinatus, Rt upper trap  UPPER EXTREMITY ROM:  ROM Right Eval 04/14/2024 Left Eval 04/14/2024  Shoulder flexion [facility]  Shoulder extension    Shoulder abduction [facility]  Shoulder adduction    Shoulder extension    Shoulder internal rotation 62 in 75 deg abduction supine AROM   Shoulder external rotation 80 in 75 deg abduction supine AROM    (Blank rows = not  tested)   UPPER EXTREMITY MMT:  MMT Right Eval 04/14/2024 Left Eval 04/14/2024 Right 05/12/2024  Shoulder flexion 5/5 5/5 4+/5  Shoulder extension     Shoulder abduction 5/5 5/5 4+/5  Shoulder adduction     Shoulder extension     Shoulder internal rotation 5/5 5/5   Shoulder external rotation 3/5 5/5 3+/5   (Blank rows = not tested)   LOWER EXTREMITY ROM:   ROM Right Eval 04/14/2024 Left Eval 04/14/2024  Hip flexion    Hip extension    Hip abduction    Hip adduction    Hip internal rotation    Hip external rotation    Knee flexion 115 c pain 105 c pain  Knee extension    Ankle dorsiflexion    Ankle plantarflexion    Ankle inversion    Ankle eversion     (Blank rows = not tested)  LOWER EXTREMITY MMT:  MMT Right Eval 04/14/2024 Left Eval 04/14/2024 Left 05/02/2024  Hip flexion 5/5 5/5   Knee flexion 5/5 5/5   Knee extension 5/5 82, 84 lbs 4/5 40, 37 lbs  50, 47.4 lbs  Ankle dorsiflexion 5/5 5/5    (Blank rows = not tested)  SPECIAL TESTS:  04/14/2024 (-) Painful arc, drop arm, empty can, lift off on Rt shoulder.  (+) ER lag noted  FUNCTIONAL TESTS:  04/14/2024 18 inch chair transfer: able on 1st attempt with deviation to Rt leg WB Lt SLS: < 5 seconds Rt SLS: < 5 seconds  GAIT: 04/14/2024 Independent ambulation on level surfaces without noted changes.                                                                                                                                                                         TODAY'S TREATMENT     DATE:  05/12/2024 Therex: UBE LE only 9 mins lvl 4.5 with 15 second interval faster at top of each minute from 2 to 7   - seat 17 Sidelying Rt shoulder ER c towel under arm 1 lb x 15 , x 15 with fatigue hold  Sidelying Rt shoulder abduction  x 15 1 lb Sidelying Rt shoulder flexion x 15 1 lb  Knee machine double leg up  single leg lowering eccentrics 10 lbs x 15 bilateral    Neuro Re-ed (scapular control, muscle  activation) Standing blue band rows c scapular retraction focus x 15  Standing bilateral gh ext blue band x 15    TODAY'S TREATMENT     DATE:  05/07/2024 Therex: UBE LE only 8 mins lvl 4.0 with 15 second interval faster at top of each minute   - seat 17 UBE UE only 2 mins fwd/2 mins backward lvl 3.0  Knee machine double leg up single leg lowering eccentrics 10 lbs x 10 bilateral (small amount performed to check response - some knee joint popping noted in activity )   Neuro Re-ed (scapular control, muscle activation) Standing blue band rows c scapular retraction focus 2 x 15  Standing bilateral gh ext blue band 2 x 15  Green band ER walk out isometric with towel under arm 5 sec hold x 10  Rt shoulder  Standing TKE in small physio ball  5 sec hold x 10 , performed bilaterally   TODAY'S TREATMENT     DATE:  05/05/2024 Therex: Recumbent bike lvl 4 10 mins with intervals 15 seconds top of each minute from min 2 to min 8, - seat all the way back  TherActivity (to improve stairs, transfers, stairs) Leg press double leg 93 lbs x 20, leg press 43 lbs 2 x 15 bilateral  Lateral step down slow lowering focus 4 inch step x 15 bilateral    Neuro Re-ed (scapular control, muscle activation) Standing blue band rows c scapular retraction focus 2 x 15  Standing bilateral gh ext blue band 2 x 15  Blue band ER walk out isometric with towel under arm 5 sec hold x 10  Rt shoulder   TODAY'S TREATMENT     DATE:  05/02/2024 Therex: UBE LE only lvl 5.0 5 mins, UE only reverse lvl 3.0 4 mins Seated quad set with SLR 2 x 10 bilateral with review for home.    TherActivity (to improve stairs, transfers, stairs) Leg press double leg 93 lbs x 15, leg press 37 lbs x 15 bilateral    Neuro Re-ed (scapular control, muscle activation) Standing blue band rows c scapular retraction focus 2 x 15  Standing bilateral gh ext blue band x 10  Blue band ER walk out isometric with towel under arm 5 sec hold x 5 each    PATIENT EDUCATION:  05/02/2024 Education details: HEP update Person educated: Patient Education method: Programmer, multimedia, Demonstration, Verbal cues, and Handouts Education comprehension: verbalized understanding, returned demonstration, and verbal cues required  HOME EXERCISE PROGRAM: Access Code: [email or web address] URL: [email or web address] Date: 05/12/2024 Prepared by: Ozell Silvan  Exercises - Seated Scapular Retraction  - 3-5 x daily - 7 x weekly - 1 sets - 10 reps - 3-5 hold - Standing Shoulder Posterior Capsule Stretch (Mirrored)  - 2 x daily - 7 x weekly - 1 sets - 5 reps - 15 hold - Seated Quad Set (Mirrored)  - 3-5 x daily - 7 x weekly - 1 sets - 10 reps - 5 hold - Seated Straight Leg Heel Taps  - 1-2 x daily - 7 x weekly - 3 sets - 10 reps - Standing Bilateral Low Shoulder Row with Anchored Resistance  - 1 x daily - 7 x weekly - 2-3 sets - 10-15 reps - Shoulder Extension with Resistance  - 1 x daily - 7 x weekly - 1-2 sets - 10-15 reps - Shoulder External Rotation Reactive Isometrics  -  1 x daily - 7 x weekly - 1 sets - 10 reps - 5-15 hold - Sidelying Shoulder External Rotation (Mirrored)  - 1-2 x daily - 7 x weekly - 2-3 sets - 10-15 reps - Sidelying Shoulder Abduction Palm Forward  - 1 x daily - 7 x weekly - 2-3 sets - 10-15 reps - Sidelying Shoulder Flexion 15 Degrees (Mirrored)  - 1 x daily - 7 x weekly - 2-3 sets - 10-15 reps  ASSESSMENT:  CLINICAL IMPRESSION: Patient specific functional scale reporting showed improvements related to LE but shoulder activity was unchanged.  Recheck of shoulder strength showed continued weakness that may benefit from progression.   Progressed Rt shoulder strengthening in sidelying activity to promote improvement in strength and control in area.     OBJECTIVE IMPAIRMENTS: decreased activity tolerance, decreased balance, decreased coordination, decreased endurance, decreased mobility, difficulty walking, decreased ROM, decreased  strength, hypomobility, increased fascial restrictions, impaired perceived functional ability, increased muscle spasms, impaired flexibility, impaired UE functional use, improper body mechanics, postural dysfunction, and pain.   ACTIVITY LIMITATIONS: carrying, lifting, bending, standing, squatting, sleeping, stairs, transfers, reach over head, and locomotion level  PARTICIPATION LIMITATIONS: meal prep, cleaning, laundry, interpersonal relationship, community activity, yard work, and exercise routine  PERSONAL FACTORS: Time since onset of injury/illness/exacerbation and multiple body parts are also affecting patient's functional outcome.   REHAB POTENTIAL: Good  CLINICAL DECISION MAKING: Evolving/moderate complexity  EVALUATION COMPLEXITY: Moderate   GOALS: Goals reviewed with patient? Yes  SHORT TERM GOALS: (target date for Short term goals are 3 weeks 05/05/2024)   1.  Patient will demonstrate independent use of home exercise program to maintain progress from in clinic treatments. Goal status: partially met.   LONG TERM GOALS: (target dates for all long term goals are 10 weeks  06/23/2024 )   1. Patient will demonstrate/report pain at worst less than or equal to 2/10 to facilitate minimal limitation in daily activity secondary to pain symptoms. Goal status: on going 05/05/2024   2. Patient will demonstrate independent use of home exercise program to facilitate ability to maintain/progress functional gains from skilled physical therapy services. Goal status:  on going 05/05/2024   3. Patient will demonstrate Patient specific functional scale avg > or = 8/10 to indicate reduced disability due to condition.   Goal status:  on going 05/05/2024   4.  Patient will demonstrate bilateral  LE MMT 5/5, Lt knee extension dynamometry with 15% of Rt to faciltiate usual transfers, stairs, squatting at PLOF for daily life.   Goal status:  on going 05/05/2024   5.  Patient will demonstrate Lt  shoulder MMT 5/5 throughout to facilitate usual reaching, lifting, carrying at PLOF s limitation.  Goal status:  on going 05/05/2024   6.  Patient will demonstrate ascending/descending stairs reciprocal gait pattern.  Goal status:  on going 05/05/2024   7.  Patient will demonstrate bilateral SLS > 10 seconds to facilitate stability in ambulation.  Goal Status:  on going 05/05/2024   PLAN:  PT FREQUENCY: 1-2x/week  PT DURATION: 10 weeks  PLANNED INTERVENTIONS: Can include 02853- PT Re-evaluation, 97110-Therapeutic exercises, 97530- Therapeutic activity, 97112- Neuromuscular re-education, 97535- Self Care, 97140- Manual therapy, [phone]- Gait training, [phone]- Orthotic Fit/training, [phone]- Canalith repositioning, [id number]- Aquatic Therapy, [phone]- Electrical stimulation (unattended), [id number] Physical performance testing, 97016- Vasopneumatic device, [id number]- Ultrasound, [id number]- Traction (mechanical), [id number]- Ionotophoresis 4mg /ml Dexamethasone,  20560 - Needle insertion w/o injection 1 or 2 muscles, 20561 - Needle insertion w/o injection 3 or more muscles.  Patient/Family education, Balance training, Stair training, Taping, Dry Needling, Joint mobilization, Joint manipulation, Spinal manipulation, Spinal mobilization, Scar mobilization, Vestibular training, Visual/preceptual remediation/compensation, DME instructions, Cryotherapy, and Moist heat.  All performed as medically necessary.  All included unless contraindicated  PLAN FOR NEXT SESSION: Continued quad strengthening.  Rt shoulder follow up on new lying down strengthening exercises.  WILL NEED [facility] MEDICARE RECERT DUE TO DATE KENDALL Ozell Silvan, PT, DPT, OCS, ATC 05/12/24  9:26 AM     Date of referral: 03/28/2024 Referring provider: Jerri Kay HERO, MD Referring diagnosis? M25.511,G89.29 (ICD-10-CM) - Chronic right shoulder pain M25.562,G89.29 (ICD-10-CM) - Chronic pain of left knee Treatment diagnosis? (if different than referring  diagnosis) same  What was this (referring dx) caused by? Ongoing Issue and Arthritis  Lysle of Condition: Chronic (continuous duration > 3 months)   Laterality: Both  Current Functional Measure Score: Patient Specific Functional Scale 4/10 avg today  Objective measurements identify impairments when they are compared to normal values, the uninvolved extremity, and prior level of function.  [x]  Yes  []  No  Objective assessment of functional ability: Moderate functional limitations   Briefly describe symptoms: Pt indicated history of scope on Lt knee years ago with complaints of continued pain with crackling/popping noise.  Does reported some trouble with both but Lt > Rt.  Reported trouble with stairs going up more than down with some giving way.  Reported full bending trouble as well.   Rt shoulder indicated to possibly be connected to fall while mowing several years ago.  Reported increased trouble for some time but recovered some but not completely improved.  Limiting in motion and lifting due to Rt shoulder symptoms.  Limited in workout routine.   Pt indicated sleeping can impact sleep at times.   How did symptoms start: Insidious onset  Average pain intensity:  Last 24 hours: up to 8-9/10  Past week: up to 8-9/10  How often does the pt experience symptoms? Frequently  How much have the symptoms interfered with usual daily activities? Quite a bit  How has condition changed since care began at this facility? NA - initial visit  In general, how is the patients overall health? Good   BACK PAIN (STarT Back Screening Tool) No

## 2024-05-14 ENCOUNTER — Encounter: Admitting: Rehabilitative and Restorative Service Providers"

## 2024-05-27 ENCOUNTER — Ambulatory Visit: Admitting: Rehabilitative and Restorative Service Providers"

## 2024-05-27 ENCOUNTER — Encounter: Payer: Self-pay | Admitting: Rehabilitative and Restorative Service Providers"

## 2024-05-27 DIAGNOSIS — R262 Difficulty in walking, not elsewhere classified: Secondary | ICD-10-CM | POA: Diagnosis not present

## 2024-05-27 DIAGNOSIS — M25511 Pain in right shoulder: Secondary | ICD-10-CM | POA: Diagnosis not present

## 2024-05-27 DIAGNOSIS — M25562 Pain in left knee: Secondary | ICD-10-CM

## 2024-05-27 DIAGNOSIS — G8929 Other chronic pain: Secondary | ICD-10-CM

## 2024-05-27 DIAGNOSIS — M6281 Muscle weakness (generalized): Secondary | ICD-10-CM

## 2024-05-27 NOTE — Therapy (Signed)
 OUTPATIENT PHYSICAL THERAPY TREATMENT   Patient Name: Jeremy Estrada MRN: [id number] DOB:03/24/1961, 63 y.o., male Today's Date: 05/27/2024  END OF SESSION:  PT End of Session - 05/27/24 1351     Visit Number 7    Number of Visits 20    Date for PT Re-Evaluation 06/24/24    Authorization Type [employer] $20 copay    Authorization Time Period 04/14/2024 - 05/12/2024    Authorization - Number of Visits 8    Progress Note Due on Visit 10    PT Start Time 1349    PT Stop Time 1432    PT Time Calculation (min) 43 min    Activity Tolerance Patient tolerated treatment well;No increased pain;Patient limited by pain    Behavior During Therapy [facility] for tasks assessed/performed                History reviewed. No pertinent past medical history. Past Surgical History:  Procedure Laterality Date   COLONOSCOPY WITH PROPOFOL  N/A 12/19/2016   Procedure: COLONOSCOPY WITH PROPOFOL ;  Surgeon: Gladis MARLA Louder, MD;  Location: [facility];  Service: Endoscopy;  Laterality: N/A;   KNEE ARTHROSCOPY Left    There are no active problems to display for this patient.   PCP: Rexanne Ingle MD  REFERRING PROVIDER: Jerri Kay HERO, MD  REFERRING DIAG: [phone] (ICD-10-CM) - Chronic right shoulder pain M25.562,G89.29 (ICD-10-CM) - Chronic pain of left knee  THERAPY DIAG:  Chronic pain of left knee - Plan: PT plan of care cert/re-cert  Chronic right shoulder pain - Plan: PT plan of care cert/re-cert  Difficulty in walking, not elsewhere classified - Plan: PT plan of care cert/re-cert  Muscle weakness (generalized) - Plan: PT plan of care cert/re-cert  Rationale for Evaluation and Treatment: Rehabilitation  ONSET DATE: Chronic complaints, worsened last 1-2 years  SUBJECTIVE:   SUBJECTIVE STATEMENT: Jeremy Estrada notes he is limited by his bilateral knee arthritis/pain and right shoulder pain.  PERTINENT HISTORY: See above  PAIN:  NPRS scale: 3-5/10 for bilateral knees and right shoulders  this week Pain location: Rt shoulder, Lt knee > Rt knee Pain description: knees: sharp at times; everyday achy while shoulder is more of an ache Aggravating factors: knee: full bending, WB pressure, going up stairs  , shoulder: throwing motion, overhead lifting/reaching.  Relieving factors: ibuprofen  PRECAUTIONS: None  WEIGHT BEARING RESTRICTIONS: No  FALLS:  Has patient fallen in last 6 months? No  LIVING ENVIRONMENT: Lives in: House/apartment Stairs: stairs but not to bedroom.   OCCUPATION: Retired   PLOF: Independent, previously used gym until the last few months, yardwork  PATIENT GOALS: Reduce pain  OBJECTIVE:   IMAGING 03/28/2024 xray knee: X-rays of the left knee show advanced tricompartmental osteoarthritis with  significant joint space narrowing and osteophytic changes.   03/28/2024 xray shoulder X-rays of the right shoulder show age-appropriate degenerative changes.   No structural abnormalities.  No acute abnormalities.   PATIENT SURVEYS:  Patient-Specific Activity Scoring Scheme  0 represents "unable to perform." 10 represents "able to perform at prior level. 0 1 2 3 4 5 6 7 8 9  10 (Date and Score)   Activity Eval  04/14/2024  05/12/2024  1. Stairs with weight  6  9  2. Getting off chair  2  7  3. Run 6 5  4. Lifting things away from body 2 2  5.    Score 4 5.75 avg    Total score = sum of the activity scores/number of activities Minimum detectable  change (90%CI) for average score = 2 points Minimum detectable change (90%CI) for single activity score = 3 points  COGNITION: 04/14/2024 Overall cognitive status: WFL    SENSATION: 04/14/2024 No specific testing today  EDEMA:  04/14/2024 No specific joint edema observed/measured today  MUSCLE LENGTH: 04/14/2024 No specific testing today  POSTURE:  04/14/2024 Rounded shoulders bilateral   PALPATION 04/14/2024 Trigger points in Rt infraspinatus, Rt upper trap  UPPER EXTREMITY ROM:  ROM  Right Eval 04/14/2024 Left Eval 04/14/2024 Right 05/27/2024  Shoulder flexion [facility] WFL 150  Shoulder extension     Shoulder abduction [facility]   Shoulder horizontal adduction   30  Shoulder extension     Shoulder internal rotation 62 in 75 deg abduction supine AROM  25 degrees at 70 degrees abduction  Shoulder external rotation 80 in 75 deg abduction supine AROM  90 degrees at 70 degrees abduction   (Blank rows = not tested)   UPPER EXTREMITY MMT:  MMT Right Eval 04/14/2024 Left Eval 04/14/2024 Right 05/12/2024 Left/Right 05/27/2024 assessed in pounds with hand-held dynamometer  Shoulder flexion 5/5 5/5 4+/5   Shoulder extension      Shoulder abduction 5/5 5/5 4+/5   Shoulder adduction      Shoulder extension      Shoulder internal rotation 5/5 5/5  49.6/48.7  Shoulder external rotation 3/5 5/5 3+/5 39.6/9.4   (Blank rows = not tested)   LOWER EXTREMITY ROM:   ROM Right Eval 04/14/2024 Left Eval 04/14/2024  Hip flexion    Hip extension    Hip abduction    Hip adduction    Hip internal rotation    Hip external rotation    Knee flexion 115 c pain 105 c pain  Knee extension    Ankle dorsiflexion    Ankle plantarflexion    Ankle inversion    Ankle eversion     (Blank rows = not tested)  LOWER EXTREMITY MMT:  MMT Right Eval 04/14/2024 Left Eval 04/14/2024 Left 05/02/2024  Hip flexion 5/5 5/5   Knee flexion 5/5 5/5   Knee extension 5/5 82, 84 lbs 4/5 40, 37 lbs  50, 47.4 lbs  Ankle dorsiflexion 5/5 5/5    (Blank rows = not tested)  SPECIAL TESTS:  04/14/2024 (-) Painful arc, drop arm, empty can, lift off on Rt shoulder.  (+) ER lag noted  FUNCTIONAL TESTS:  04/14/2024 18 inch chair transfer: able on 1st attempt with deviation to Rt leg WB Lt SLS: < 5 seconds Rt SLS: < 5 seconds  GAIT: 04/14/2024 Independent ambulation on level surfaces without noted changes.  TODAY'S TREATMENT     DATE:  05/27/2024 Seated straight leg raises 3 sets of 5 for 3 seconds Seated knee extension machine 25#; 35# and 45# 10 x each 90-40 degrees (to avoid patellofemoral irritation) with slow eccentric emphasis Thumb up the back stretch 10 x 10 seconds  02464: Objective shoulder reassessment for active range of motion and strength; reviewed shoulder anatomy with shoulder model, imaging and compared to this to his objective findings today.  Reviewed knee anatomy, imaging and discussed recommended activities (including a verbal review of his entire home exercise program) with recommendations to schedule additional appointments to fine-tune and simplify his quadriceps strength focused knee exercises and address posterior capsular tightness, scapular and rotator cuff weakness with his right shoulder.   TODAY'S TREATMENT     DATE:  05/12/2024 Therex: UBE LE only 9 mins lvl 4.5 with 15 second interval faster at top of each minute from 2 to 7   - seat 17 Sidelying Rt shoulder ER c towel under arm 1 lb x 15 , x 15 with fatigue hold  Sidelying Rt shoulder abduction  x 15 1 lb Sidelying Rt shoulder flexion x 15 1 lb  Knee machine double leg up single leg lowering eccentrics 10 lbs x 15 bilateral    Neuro Re-ed (scapular control, muscle activation) Standing blue band rows c scapular retraction focus x 15  Standing bilateral gh ext blue band x 15    TODAY'S TREATMENT     DATE:  05/07/2024 Therex: UBE LE only 8 mins lvl 4.0 with 15 second interval faster at top of each minute   - seat 17 UBE UE only 2 mins fwd/2 mins backward lvl 3.0  Knee machine double leg up single leg lowering eccentrics 10 lbs x 10 bilateral (small amount performed to check response - some knee joint popping noted in activity )   Neuro Re-ed (scapular control, muscle activation) Standing blue band rows c scapular retraction focus 2 x 15  Standing  bilateral gh ext blue band 2 x 15  Green band ER walk out isometric with towel under arm 5 sec hold x 10  Rt shoulder  Standing TKE in small physio ball  5 sec hold x 10 , performed bilaterally   PATIENT EDUCATION:  05/02/2024 Education details: HEP update Person educated: Patient Education method: Programmer, multimedia, Facilities manager, Verbal cues, and Handouts Education comprehension: verbalized understanding, returned demonstration, and verbal cues required  HOME EXERCISE PROGRAM: Access Code: [email or web address] URL: [email or web address] Date: 05/12/2024 Prepared by: Ozell Silvan  Exercises - Seated Scapular Retraction  - 3-5 x daily - 7 x weekly - 1 sets - 10 reps - 3-5 hold - Standing Shoulder Posterior Capsule Stretch (Mirrored)  - 2 x daily - 7 x weekly - 1 sets - 5 reps - 15 hold - Seated Quad Set (Mirrored)  - 3-5 x daily - 7 x weekly - 1 sets - 10 reps - 5 hold - Seated Straight Leg Heel Taps  - 1-2 x daily - 7 x weekly - 3 sets - 10 reps - Standing Bilateral Low Shoulder Row with Anchored Resistance  - 1 x daily - 7 x weekly - 2-3 sets - 10-15 reps - Shoulder Extension with Resistance  - 1 x daily - 7 x weekly - 1-2 sets - 10-15 reps - Shoulder External Rotation Reactive Isometrics  - 1 x daily - 7 x weekly - 1 sets - 10 reps - 5-15 hold - Sidelying Shoulder External Rotation (Mirrored)  - 1-2  x daily - 7 x weekly - 2-3 sets - 10-15 reps - Sidelying Shoulder Abduction Palm Forward  - 1 x daily - 7 x weekly - 2-3 sets - 10-15 reps - Sidelying Shoulder Flexion 15 Degrees (Mirrored)  - 1 x daily - 7 x weekly - 2-3 sets - 10-15 reps  ASSESSMENT:  CLINICAL IMPRESSION: Jeremy Estrada mentioned he is still having a very difficult time with his right shoulder.  Objective reassessment today showed a very tight posterior capsule, particularly with internal rotation along with significant external rotation strength impairments.  This is generally a good indicator of a rotator cuff tear and Jeremy Estrada  had mentioned he was very interested in getting an MRI to see if he might be a candidate for rotator cuff repair.  I suggested he schedule a follow-up with Dr. Jerri to ask about additional imaging and if he might be a candidate for this procedure as it certainly appears he does have a rotator cuff tear.  I discussed with Jeremy Estrada that he would definitely need to get imaging to confirm what today's examination suggests and he would still benefit from continuing his supervised physical therapy focused on quadriceps, scapular and rotator cuff strengthening along with select internal rotation and posterior capsular stretching from the right shoulder.   OBJECTIVE IMPAIRMENTS: decreased activity tolerance, decreased balance, decreased coordination, decreased endurance, decreased mobility, difficulty walking, decreased ROM, decreased strength, hypomobility, increased fascial restrictions, impaired perceived functional ability, increased muscle spasms, impaired flexibility, impaired UE functional use, improper body mechanics, postural dysfunction, and pain.   ACTIVITY LIMITATIONS: carrying, lifting, bending, standing, squatting, sleeping, stairs, transfers, reach over head, and locomotion level  PARTICIPATION LIMITATIONS: meal prep, cleaning, laundry, interpersonal relationship, community activity, yard work, and exercise routine  PERSONAL FACTORS: Time since onset of injury/illness/exacerbation and multiple body parts are also affecting patient's functional outcome.   REHAB POTENTIAL: Good  CLINICAL DECISION MAKING: Evolving/moderate complexity  EVALUATION COMPLEXITY: Moderate   GOALS: Goals reviewed with patient? Yes  SHORT TERM GOALS: (target date for Short term goals are 3 weeks 05/05/2024)   1.  Patient will demonstrate independent use of home exercise program to maintain progress from in clinic treatments. Goal status: Met 05/27/2024  LONG TERM GOALS: (target dates for all long term goals are 10  weeks  06/24/2024 )   1. Patient will demonstrate/report pain at worst less than or equal to 2/10 to facilitate minimal limitation in daily activity secondary to pain symptoms. Goal status: on going 05/27/2024   2. Patient will demonstrate independent use of home exercise program to facilitate ability to maintain/progress functional gains from skilled physical therapy services. Goal status:  on going 05/27/2024   3. Patient will demonstrate Patient specific functional scale avg > or = 8/10 to indicate reduced disability due to condition.   Goal status:  on going 05/05/2024   4.  Patient will demonstrate bilateral  LE MMT 5/5, Lt knee extension dynamometry with 15% of Rt to faciltiate usual transfers, stairs, squatting at PLOF for daily life.   Goal status:  on going 05/05/2024   5.  Patient will demonstrate Lt shoulder MMT 5/5 throughout to facilitate usual reaching, lifting, carrying at PLOF s limitation.  Goal status:  on going 05/27/2024   6.  Patient will demonstrate ascending/descending stairs reciprocal gait pattern.  Goal status:  on going 05/05/2024   7.  Patient will demonstrate bilateral SLS > 10 seconds to facilitate stability in ambulation.  Goal Status:  on going 05/05/2024   PLAN:  PT FREQUENCY: 1-2x/week  PT DURATION: 4 weeks  PLANNED INTERVENTIONS: Can include 02853- PT Re-evaluation, 97110-Therapeutic exercises, 97530- Therapeutic activity, [id number]- Neuromuscular re-education, 97535- Self Care, 97140- Manual therapy, [phone]- Gait training, [phone]- Orthotic Fit/training, [phone]- Canalith repositioning, [id number]- Aquatic Therapy, [phone]- Electrical stimulation (unattended), [id number] Physical performance testing, 97016- Vasopneumatic device, [id number]- Ultrasound, [id number]- Traction (mechanical), [id number]- Ionotophoresis 4mg /ml Dexamethasone,  20560 - Needle insertion w/o injection 1 or 2 muscles, 20561 - Needle insertion w/o injection 3 or more muscles.   Patient/Family education, Balance training,  Stair training, Taping, Dry Needling, Joint mobilization, Joint manipulation, Spinal manipulation, Spinal mobilization, Scar mobilization, Vestibular training, Visual/preceptual remediation/compensation, DME instructions, Cryotherapy, and Moist heat.  All performed as medically necessary.  All included unless contraindicated  PLAN FOR NEXT SESSION: Continued quadriceps strengthening.  Posterior and inferior capsule flexibility, scapular rotator cuff strengthening for the shoulder.  Recommend he follow-up with Dr. Jerri about additional imaging for his right shoulder.   Myer LELON Ivory PT, MPT 05/27/24  3:56 PM     Date of referral: 03/28/2024 Referring provider: Jerri Kay HERO, MD Referring diagnosis? M25.511,G89.29 (ICD-10-CM) - Chronic right shoulder pain M25.562,G89.29 (ICD-10-CM) - Chronic pain of left knee Treatment diagnosis? (if different than referring diagnosis) same  What was this (referring dx) caused by? Ongoing Issue and Arthritis  Lysle of Condition: Chronic (continuous duration > 3 months)   Laterality: Both  Current Functional Measure Score: Patient Specific Functional Scale 4/10 avg today  Objective measurements identify impairments when they are compared to normal values, the uninvolved extremity, and prior level of function.  [x]  Yes  []  No  Objective assessment of functional ability: Moderate functional limitations   Briefly describe symptoms: Pt indicated history of scope on Lt knee years ago with complaints of continued pain with crackling/popping noise.  Does reported some trouble with both but Lt > Rt.  Reported trouble with stairs going up more than down with some giving way.  Reported full bending trouble as well.  Subjective strength gains reported although functional impairments persist.  Rt shoulder indicated to possibly be connected to fall while mowing several years ago.  Reported increased trouble for some time but recovered some but not completely improved.   Limiting in motion and lifting due to Rt shoulder symptoms.  Limited in workout routine.   Pt indicated sleeping improved since starting PT but weakness persists.  How did symptoms start: Insidious onset  Average pain intensity:  Last 24 hours: up to 5/10  Past week: up to 7/10  How often does the pt experience symptoms? Frequently  How much have the symptoms interfered with usual daily activities? Quite a bit  How has condition changed since care began at this facility? Improved sleep, improving strength but recommended additional PT at evaluation and stand by that recommendation at re-cert today to address remaining impairments and long-term goals.  In general, how is the patients overall health? Good   BACK PAIN (STarT Back Screening Tool) No

## 2024-06-06 ENCOUNTER — Ambulatory Visit (INDEPENDENT_AMBULATORY_CARE_PROVIDER_SITE_OTHER): Admitting: Orthopaedic Surgery

## 2024-06-06 ENCOUNTER — Telehealth: Payer: Self-pay

## 2024-06-06 DIAGNOSIS — E119 Type 2 diabetes mellitus without complications: Secondary | ICD-10-CM

## 2024-06-06 DIAGNOSIS — M1712 Unilateral primary osteoarthritis, left knee: Secondary | ICD-10-CM | POA: Diagnosis not present

## 2024-06-06 DIAGNOSIS — G8929 Other chronic pain: Secondary | ICD-10-CM

## 2024-06-06 DIAGNOSIS — M1711 Unilateral primary osteoarthritis, right knee: Secondary | ICD-10-CM | POA: Diagnosis not present

## 2024-06-06 DIAGNOSIS — M25511 Pain in right shoulder: Secondary | ICD-10-CM | POA: Diagnosis not present

## 2024-06-06 DIAGNOSIS — M170 Bilateral primary osteoarthritis of knee: Secondary | ICD-10-CM | POA: Diagnosis not present

## 2024-06-06 LAB — POCT GLYCOSYLATED HEMOGLOBIN (HGB A1C): Hemoglobin A1C: 6.3 % — AB (ref 4.0–5.6)

## 2024-06-06 MED ORDER — BUPIVACAINE HCL 0.5 % IJ SOLN
2.0000 mL | INTRAMUSCULAR | Status: AC | PRN
  Administered 2024-06-06: 2 mL via INTRA_ARTICULAR

## 2024-06-06 MED ORDER — METHYLPREDNISOLONE ACETATE 40 MG/ML IJ SUSP
40.0000 mg | INTRAMUSCULAR | Status: AC | PRN
  Administered 2024-06-06: 40 mg via INTRA_ARTICULAR

## 2024-06-06 MED ORDER — LIDOCAINE HCL 1 % IJ SOLN
2.0000 mL | INTRAMUSCULAR | Status: AC | PRN
  Administered 2024-06-06: 2 mL

## 2024-06-06 NOTE — Telephone Encounter (Signed)
Please submit for right knee gel inj 

## 2024-06-06 NOTE — Progress Notes (Signed)
 Office Visit Note   Patient: Jeremy Estrada           Date of Birth: [birth year]           MRN: [id number] Visit Date: 06/06/2024              Requested by: Rexanne Ingle, MD [address] [place],  [place] [address] PCP: Rexanne Ingle, MD   Assessment & Plan: Visit Diagnoses:  1. Primary osteoarthritis of right knee   2. Primary osteoarthritis of left knee   3. Chronic right shoulder pain     Plan: History of Present Illness Jeremy Estrada is a 63 year old male with bilateral knee arthritis and possible rotator cuff tear who presents for evaluation and management of knee pain and shoulder issues.  He experiences persistent pain in his left knee with minimal relief from physical therapy and is considering an injection for pain management.  Swelling in the right knee has been present since Christmas after an incident where he nearly fell. The swelling persists, though it is less painful than the left knee. He is exploring treatment options for the swelling.  Regarding his shoulder, there is a concern for a possible rotator cuff tear, and he is considering further diagnostic evaluation to confirm the presence and extent of any tear.  He has diabetes, generally well-controlled, but is concerned about the potential effects of cortisone on his blood sugar levels.  Physical Exam MUSCULOSKELETAL:   Results LABS Continuous blood glucose monitor: 120 mg/dL  Assessment and Plan Left knee osteoarthritis Chronic osteoarthritis with significant arthritis on x-rays. Cortisone injection chosen for pain management. Explained cortisone risks and non-carcinogenic nature. - Administer cortisone injection to the left knee for pain relief.  Right knee swelling, possible osteoarthritis Chronic swelling. Discussed cortisone and gel injection options. Opted for cortisone due to gel approval delay. - Perform A1c test to assess diabetes control before considering cortisone injection.  Right  shoulder pain, possible rotator cuff tear Suspected rotator cuff tear. MRI needed to evaluate tear type and surgical benefit. - Order MRI of the right shoulder to assess the rotator cuff tear.  This patient is diagnosed with osteoarthritis of the knee(s).    Radiographs show evidence of joint space narrowing, osteophytes, subchondral sclerosis and/or subchondral cysts.  This patient has knee pain which interferes with functional and activities of daily living.    This patient has experienced inadequate response, adverse effects and/or intolerance with conservative treatments such as acetaminophen, NSAIDS, topical creams, physical therapy or regular exercise, knee bracing and/or weight loss.   This patient has experienced inadequate response or has a contraindication to intra articular steroid injections for at least 3 months.   This patient is not scheduled to have a total knee replacement within 6 months of starting treatment with viscosupplementation.  Follow-Up Instructions: No follow-ups on file.   Orders:  Orders Placed This Encounter  Procedures   Large Joint Inj   No orders of the defined types were placed in this encounter.     Procedures: Large Joint Inj: L knee on 06/06/2024 9:45 AM Details: 22 G needle Medications: 2 mL bupivacaine  0.5 %; 2 mL lidocaine  1 %; 40 mg methylPREDNISolone  acetate 40 MG/ML Outcome: tolerated well, no immediate complications Patient was prepped and draped in the usual sterile fashion.       Clinical Data: No additional findings.   Subjective: Chief Complaint  Patient presents with   Left Knee - Pain   Right Shoulder -  Pain    HPI  Review of Systems  Constitutional: Negative.   HENT: Negative.    Eyes: Negative.   Respiratory: Negative.    Cardiovascular: Negative.   Gastrointestinal: Negative.   Endocrine: Negative.   Genitourinary: Negative.   Skin: Negative.   Allergic/Immunologic: Negative.   Neurological: Negative.    Hematological: Negative.   Psychiatric/Behavioral: Negative.    All other systems reviewed and are negative.    Objective: Vital Signs: There were no vitals taken for this visit.  Physical Exam Vitals and nursing note reviewed.  Constitutional:      Appearance: He is well-developed.  HENT:     Head: Normocephalic and atraumatic.  Eyes:     Pupils: Pupils are equal, round, and reactive to light.  Pulmonary:     Effort: Pulmonary effort is normal.  Abdominal:     Palpations: Abdomen is soft.  Musculoskeletal:        General: Normal range of motion.     Cervical back: Neck supple.  Skin:    General: Skin is warm.  Neurological:     Mental Status: He is alert and oriented to person, place, and time.  Psychiatric:        Behavior: Behavior normal.        Thought Content: Thought content normal.        Judgment: Judgment normal.     Ortho Exam  Specialty Comments:  No specialty comments available.  Imaging: No results found.   PMFS History: There are no active problems to display for this patient.  No past medical history on file.  No family history on file.  Past Surgical History:  Procedure Laterality Date   COLONOSCOPY WITH PROPOFOL  N/A 12/19/2016   Procedure: COLONOSCOPY WITH PROPOFOL ;  Surgeon: Gladis MARLA Louder, MD;  Location: [facility];  Service: Endoscopy;  Laterality: N/A;   KNEE ARTHROSCOPY Left    Social History   Occupational History   Not on file  Tobacco Use   Smoking status: Never   Smokeless tobacco: Never  Substance and Sexual Activity   Alcohol use: Not on file   Drug use: Not on file   Sexual activity: Not on file

## 2024-06-11 NOTE — Telephone Encounter (Signed)
VOB submitted for durolane, right knee

## 2024-06-16 ENCOUNTER — Ambulatory Visit: Admitting: Rehabilitative and Restorative Service Providers"

## 2024-06-16 ENCOUNTER — Encounter: Payer: Self-pay | Admitting: Rehabilitative and Restorative Service Providers"

## 2024-06-16 DIAGNOSIS — M25562 Pain in left knee: Secondary | ICD-10-CM

## 2024-06-16 DIAGNOSIS — G8929 Other chronic pain: Secondary | ICD-10-CM

## 2024-06-16 DIAGNOSIS — R262 Difficulty in walking, not elsewhere classified: Secondary | ICD-10-CM

## 2024-06-16 DIAGNOSIS — M25511 Pain in right shoulder: Secondary | ICD-10-CM

## 2024-06-16 DIAGNOSIS — M6281 Muscle weakness (generalized): Secondary | ICD-10-CM | POA: Diagnosis not present

## 2024-06-16 NOTE — Therapy (Signed)
 OUTPATIENT PHYSICAL THERAPY TREATMENT   Patient Name: Jeremy Estrada MRN: [id number] DOB:1961/06/30, 63 y.o., male Today's Date: 06/16/2024  END OF SESSION:  PT End of Session - 06/16/24 1312     Visit Number 8    Number of Visits 20    Date for PT Re-Evaluation 06/24/24    Authorization Type [employer] Medicare $20 copay    Authorization Time Period 05/27/2024 -06/24/2024    Authorization - Visit Number 2    Authorization - Number of Visits 8    Progress Note Due on Visit 17    PT Start Time 1304    PT Stop Time 1343    PT Time Calculation (min) 39 min    Activity Tolerance Patient tolerated treatment well;No increased pain    Behavior During Therapy [facility] for tasks assessed/performed                 History reviewed. No pertinent past medical history. Past Surgical History:  Procedure Laterality Date   COLONOSCOPY WITH PROPOFOL  N/A 12/19/2016   Procedure: COLONOSCOPY WITH PROPOFOL ;  Surgeon: Gladis MARLA Louder, MD;  Location: [facility];  Service: Endoscopy;  Laterality: N/A;   KNEE ARTHROSCOPY Left    There are no active problems to display for this patient.   PCP: Rexanne Ingle MD  REFERRING PROVIDER: Jerri Kay HERO, MD  REFERRING DIAG: [phone] (ICD-10-CM) - Chronic right shoulder pain M25.562,G89.29 (ICD-10-CM) - Chronic pain of left knee  THERAPY DIAG:  Chronic pain of left knee  Chronic right shoulder pain  Difficulty in walking, not elsewhere classified  Muscle weakness (generalized)  Rationale for Evaluation and Treatment: Rehabilitation  ONSET DATE: Chronic complaints, worsened last 1-2 years  SUBJECTIVE:   SUBJECTIVE STATEMENT: Pt indicated feeling like injection helped some in Lt knee, holding on Rt for now due to diabetes history.  Plan to have MRI on shoulder on 06/21/2024.    Pt indicated shoulder has been feeling ok.    PERTINENT HISTORY: See above  PAIN:  NPRS scale: 3-5/10 for bilateral knees and right shoulders this week Pain  location: Rt shoulder, Lt knee > Rt knee Pain description: knees: sharp at times; everyday achy while shoulder is more of an ache Aggravating factors: knee: full bending, WB pressure, going up stairs  , shoulder: throwing motion, overhead lifting/reaching.  Relieving factors: ibuprofen  PRECAUTIONS: None  WEIGHT BEARING RESTRICTIONS: No  FALLS:  Has patient fallen in last 6 months? No  LIVING ENVIRONMENT: Lives in: House/apartment Stairs: stairs but not to bedroom.   OCCUPATION: Retired   PLOF: Independent, previously used gym until the last few months, yardwork  PATIENT GOALS: Reduce pain  OBJECTIVE:   IMAGING 03/28/2024 xray knee: X-rays of the left knee show advanced tricompartmental osteoarthritis with  significant joint space narrowing and osteophytic changes.   03/28/2024 xray shoulder X-rays of the right shoulder show age-appropriate degenerative changes.   No structural abnormalities.  No acute abnormalities.   PATIENT SURVEYS:  Patient-Specific Activity Scoring Scheme  0 represents "unable to perform." 10 represents "able to perform at prior level. 0 1 2 3 4 5 6 7 8 9  10 (Date and Score)   Activity Eval  04/14/2024  05/12/2024  1. Stairs with weight  6  9  2. Getting off chair  2  7  3. Run 6 5  4. Lifting things away from body 2 2  5.    Score 4 5.75 avg    Total score = sum of the activity scores/number of  activities Minimum detectable change (90%CI) for average score = 2 points Minimum detectable change (90%CI) for single activity score = 3 points  COGNITION: 04/14/2024 Overall cognitive status: WFL    SENSATION: 04/14/2024 No specific testing today  EDEMA:  04/14/2024 No specific joint edema observed/measured today  MUSCLE LENGTH: 04/14/2024 No specific testing today  POSTURE:  04/14/2024 Rounded shoulders bilateral   PALPATION 04/14/2024 Trigger points in Rt infraspinatus, Rt upper trap  UPPER EXTREMITY ROM:  ROM  Right Eval 04/14/2024 Left Eval 04/14/2024 Right 05/27/2024  Shoulder flexion [facility] WFL 150  Shoulder extension     Shoulder abduction [facility]   Shoulder horizontal adduction   30  Shoulder extension     Shoulder internal rotation 62 in 75 deg abduction supine AROM  25 degrees at 70 degrees abduction  Shoulder external rotation 80 in 75 deg abduction supine AROM  90 degrees at 70 degrees abduction   (Blank rows = not tested)   UPPER EXTREMITY MMT:  MMT Right Eval 04/14/2024 Left Eval 04/14/2024 Right 05/12/2024 Left/Right 05/27/2024 assessed in pounds with hand-held dynamometer  Shoulder flexion 5/5 5/5 4+/5   Shoulder extension      Shoulder abduction 5/5 5/5 4+/5   Shoulder adduction      Shoulder extension      Shoulder internal rotation 5/5 5/5  49.6/48.7  Shoulder external rotation 3/5 5/5 3+/5 39.6/9.4   (Blank rows = not tested)   LOWER EXTREMITY ROM:   ROM Right Eval 04/14/2024 Left Eval 04/14/2024  Hip flexion    Hip extension    Hip abduction    Hip adduction    Hip internal rotation    Hip external rotation    Knee flexion 115 c pain 105 c pain  Knee extension    Ankle dorsiflexion    Ankle plantarflexion    Ankle inversion    Ankle eversion     (Blank rows = not tested)  LOWER EXTREMITY MMT:  MMT Right Eval 04/14/2024 Left Eval 04/14/2024 Left 05/02/2024  Hip flexion 5/5 5/5   Knee flexion 5/5 5/5   Knee extension 5/5 82, 84 lbs 4/5 40, 37 lbs  50, 47.4 lbs  Ankle dorsiflexion 5/5 5/5    (Blank rows = not tested)  SPECIAL TESTS:  06/16/2024: (-) Painful arc, empty can Rt shoulder.   04/14/2024 (-) Painful arc, drop arm, empty can, lift off on Rt shoulder.  (+) ER lag noted  FUNCTIONAL TESTS:  04/14/2024 18 inch chair transfer: able on 1st attempt with deviation to Rt leg WB Lt SLS: < 5 seconds Rt SLS: < 5 seconds  GAIT: 04/14/2024 Independent ambulation on level surfaces without noted changes.  TODAY'S TREATMENT     DATE:  06/16/2024 Therex: UBE LE only, seat 17 for endurance, ROM lvl 4.5 Lt sidelying Rt shoulder ER c towel under arm x20 1 lb weight Lt sidelying Rt shoulder abduction 1 lb  x 15 Lt sidelying Rt shoulder flexion x 10   Review of existing HEP with verbal cues. Discussed LE based HEP training.  Discussed gym return for strengthening.    Neuro Re-ed Standing Rt shoulder ER green band walk out 5 sec hold x 15      TODAY'S TREATMENT     DATE:  05/27/2024 Seated straight leg raises 3 sets of 5 for 3 seconds Seated knee extension machine 25#; 35# and 45# 10 x each 90-40 degrees (to avoid patellofemoral irritation) with slow eccentric emphasis Thumb up the back stretch 10 x 10 seconds  02464: Objective shoulder reassessment for active range of motion and strength; reviewed shoulder anatomy with shoulder model, imaging and compared to this to his objective findings today.  Reviewed knee anatomy, imaging and discussed recommended activities (including a verbal review of his entire home exercise program) with recommendations to schedule additional appointments to fine-tune and simplify his quadriceps strength focused knee exercises and address posterior capsular tightness, scapular and rotator cuff weakness with his right shoulder.   TODAY'S TREATMENT     DATE:  05/12/2024 Therex: UBE LE only 9 mins lvl 4.5 with 15 second interval faster at top of each minute from 2 to 7   - seat 17 Sidelying Rt shoulder ER c towel under arm 1 lb x 15 , x 15 with fatigue hold  Sidelying Rt shoulder abduction  x 15 1 lb Sidelying Rt shoulder flexion x 15 1 lb  Knee machine double leg up single leg lowering eccentrics 10 lbs x 15 bilateral    Neuro Re-ed (scapular control, muscle activation) Standing blue band rows c scapular retraction focus x 15  Standing bilateral  gh ext blue band x 15    TODAY'S TREATMENT     DATE:  05/07/2024 Therex: UBE LE only 8 mins lvl 4.0 with 15 second interval faster at top of each minute   - seat 17 UBE UE only 2 mins fwd/2 mins backward lvl 3.0  Knee machine double leg up single leg lowering eccentrics 10 lbs x 10 bilateral (small amount performed to check response - some knee joint popping noted in activity )   Neuro Re-ed (scapular control, muscle activation) Standing blue band rows c scapular retraction focus 2 x 15  Standing bilateral gh ext blue band 2 x 15  Green band ER walk out isometric with towel under arm 5 sec hold x 10  Rt shoulder  Standing TKE in small physio ball  5 sec hold x 10 , performed bilaterally   PATIENT EDUCATION:  05/02/2024 Education details: HEP update Person educated: Patient Education method: Programmer, multimedia, Facilities manager, Verbal cues, and Handouts Education comprehension: verbalized understanding, returned demonstration, and verbal cues required  HOME EXERCISE PROGRAM: Access Code: [email or web address] URL: [email or web address] Date: 06/16/2024 Prepared by: Ozell Silvan  Exercises - Seated Scapular Retraction  - 3-5 x daily - 7 x weekly - 1 sets - 10 reps - 3-5 hold - Standing Shoulder Posterior Capsule Stretch (Mirrored)  - 2 x daily - 7 x weekly - 1 sets - 5 reps - 15 hold - Seated Quad Set (Mirrored)  - 3-5 x daily - 7 x weekly - 1 sets - 10 reps - 5 hold - Standing Bilateral Low Shoulder  Row with Anchored Resistance  - 1 x daily - 7 x weekly - 2-3 sets - 10-15 reps - Shoulder Extension with Resistance  - 1 x daily - 7 x weekly - 1-2 sets - 10-15 reps - Shoulder External Rotation Reactive Isometrics  - 1 x daily - 7 x weekly - 1 sets - 10 reps - 5-15 hold - Sidelying Shoulder External Rotation (Mirrored)  - 1-2 x daily - 7 x weekly - 2-3 sets - 10-15 reps - Sidelying Shoulder Abduction Palm Forward  - 1 x daily - 7 x weekly - 2-3 sets - 10-15 reps - Sidelying Shoulder Flexion  15 Degrees (Mirrored)  - 1 x daily - 7 x weekly - 2-3 sets - 10-15 reps - Standing Shoulder Internal Rotation Stretch with Hands Behind Back  - 2-3 x daily - 7 x weekly - 1 sets - 10 reps - 10 seconds hold - Seated SLR  - 1-2 x daily - 7 x weekly - 1-2 sets - 10-15 reps - 2 hold  ASSESSMENT:  CLINICAL IMPRESSION: Continued discussion about importance of general improvements in function regardless of any tear/no tear for Rt shoulder.  Spent today primarily on review of shoulder based intervention to promote periscapular muscle activation/strengthening    OBJECTIVE IMPAIRMENTS: decreased activity tolerance, decreased balance, decreased coordination, decreased endurance, decreased mobility, difficulty walking, decreased ROM, decreased strength, hypomobility, increased fascial restrictions, impaired perceived functional ability, increased muscle spasms, impaired flexibility, impaired UE functional use, improper body mechanics, postural dysfunction, and pain.   ACTIVITY LIMITATIONS: carrying, lifting, bending, standing, squatting, sleeping, stairs, transfers, reach over head, and locomotion level  PARTICIPATION LIMITATIONS: meal prep, cleaning, laundry, interpersonal relationship, community activity, yard work, and exercise routine  PERSONAL FACTORS: Time since onset of injury/illness/exacerbation and multiple body parts are also affecting patient's functional outcome.   REHAB POTENTIAL: Good  CLINICAL DECISION MAKING: Evolving/moderate complexity  EVALUATION COMPLEXITY: Moderate   GOALS: Goals reviewed with patient? Yes  SHORT TERM GOALS: (target date for Short term goals are 3 weeks 05/05/2024)   1.  Patient will demonstrate independent use of home exercise program to maintain progress from in clinic treatments. Goal status: Met 05/27/2024  LONG TERM GOALS: (target dates for all long term goals are 10 weeks  06/24/2024 )   1. Patient will demonstrate/report pain at worst less than or equal  to 2/10 to facilitate minimal limitation in daily activity secondary to pain symptoms. Goal status: on going 05/27/2024   2. Patient will demonstrate independent use of home exercise program to facilitate ability to maintain/progress functional gains from skilled physical therapy services. Goal status:  on going 05/27/2024   3. Patient will demonstrate Patient specific functional scale avg > or = 8/10 to indicate reduced disability due to condition.   Goal status:  on going 05/05/2024   4.  Patient will demonstrate bilateral  LE MMT 5/5, Lt knee extension dynamometry with 15% of Rt to faciltiate usual transfers, stairs, squatting at PLOF for daily life.   Goal status:  on going 05/05/2024   5.  Patient will demonstrate Lt shoulder MMT 5/5 throughout to facilitate usual reaching, lifting, carrying at PLOF s limitation.  Goal status:  on going 05/27/2024   6.  Patient will demonstrate ascending/descending stairs reciprocal gait pattern.  Goal status:  on going 05/05/2024   7.  Patient will demonstrate bilateral SLS > 10 seconds to facilitate stability in ambulation.  Goal Status:  on going 05/05/2024   PLAN:  PT FREQUENCY:  1-2x/week  PT DURATION: 4 weeks  PLANNED INTERVENTIONS: Can include 02853- PT Re-evaluation, 97110-Therapeutic exercises, 97530- Therapeutic activity, [id number]- Neuromuscular re-education, 97535- Self Care, 97140- Manual therapy, [phone]- Gait training, [id number]- Orthotic Fit/training, [phone]- Canalith repositioning, [id number]- Aquatic Therapy, [phone]- Electrical stimulation (unattended), [id number] Physical performance testing, 97016- Vasopneumatic device, [id number]- Ultrasound, [id number]- Traction (mechanical), [id number]- Ionotophoresis 4mg /ml Dexamethasone,  20560 - Needle insertion w/o injection 1 or 2 muscles, 20561 - Needle insertion w/o injection 3 or more muscles.   Patient/Family education, Balance training, Stair training, Taping, Dry Needling, Joint mobilization, Joint manipulation, Spinal  manipulation, Spinal mobilization, Scar mobilization, Vestibular training, Visual/preceptual remediation/compensation, DME instructions, Cryotherapy, and Moist heat.  All performed as medically necessary.  All included unless contraindicated  PLAN FOR NEXT SESSION:  [employer] approved data until 06/24/2024   Ozell Silvan, PT, DPT, OCS, ATC 06/16/24  1:43 PM      Date of referral: 03/28/2024 Referring provider: Jerri Kay HERO, MD Referring diagnosis? M25.511,G89.29 (ICD-10-CM) - Chronic right shoulder pain M25.562,G89.29 (ICD-10-CM) - Chronic pain of left knee Treatment diagnosis? (if different than referring diagnosis) same  What was this (referring dx) caused by? Ongoing Issue and Arthritis  Lysle of Condition: Chronic (continuous duration > 3 months)   Laterality: Both  Current Functional Measure Score: Patient Specific Functional Scale 4/10 avg today  Objective measurements identify impairments when they are compared to normal values, the uninvolved extremity, and prior level of function.  [x]  Yes  []  No  Objective assessment of functional ability: Moderate functional limitations   Briefly describe symptoms: Pt indicated history of scope on Lt knee years ago with complaints of continued pain with crackling/popping noise.  Does reported some trouble with both but Lt > Rt.  Reported trouble with stairs going up more than down with some giving way.  Reported full bending trouble as well.  Subjective strength gains reported although functional impairments persist.  Rt shoulder indicated to possibly be connected to fall while mowing several years ago.  Reported increased trouble for some time but recovered some but not completely improved.  Limiting in motion and lifting due to Rt shoulder symptoms.  Limited in workout routine.   Pt indicated sleeping improved since starting PT but weakness persists.  How did symptoms start: Insidious onset  Average pain intensity:  Last 24 hours: up  to 5/10  Past week: up to 7/10  How often does the pt experience symptoms? Frequently  How much have the symptoms interfered with usual daily activities? Quite a bit  How has condition changed since care began at this facility? Improved sleep, improving strength but recommended additional PT at evaluation and stand by that recommendation at re-cert today to address remaining impairments and long-term goals.  In general, how is the patients overall health? Good   BACK PAIN (STarT Back Screening Tool) No

## 2024-06-18 ENCOUNTER — Ambulatory Visit: Admitting: Rehabilitative and Restorative Service Providers"

## 2024-06-18 ENCOUNTER — Encounter: Payer: Self-pay | Admitting: Rehabilitative and Restorative Service Providers"

## 2024-06-18 DIAGNOSIS — M6281 Muscle weakness (generalized): Secondary | ICD-10-CM | POA: Diagnosis not present

## 2024-06-18 DIAGNOSIS — M25562 Pain in left knee: Secondary | ICD-10-CM

## 2024-06-18 DIAGNOSIS — G8929 Other chronic pain: Secondary | ICD-10-CM

## 2024-06-18 DIAGNOSIS — R262 Difficulty in walking, not elsewhere classified: Secondary | ICD-10-CM | POA: Diagnosis not present

## 2024-06-18 DIAGNOSIS — M25511 Pain in right shoulder: Secondary | ICD-10-CM

## 2024-06-18 NOTE — Therapy (Signed)
 OUTPATIENT PHYSICAL THERAPY TREATMENT   Patient Name: Jeremy Estrada MRN: [id number] DOB:08-22-1961, 63 y.o., male Today's Date: 06/18/2024  END OF SESSION:  PT End of Session - 06/18/24 1439     Visit Number 9    Number of Visits 20    Date for PT Re-Evaluation 06/24/24    Authorization Type [employer] $20 copay    Authorization Time Period 05/27/2024 -06/24/2024    Authorization - Visit Number 3    Authorization - Number of Visits 8    Progress Note Due on Visit 17    PT Start Time 1435    PT Stop Time 1514    PT Time Calculation (min) 39 min    Activity Tolerance Patient tolerated treatment well    Behavior During Therapy [facility] for tasks assessed/performed                  History reviewed. No pertinent past medical history. Past Surgical History:  Procedure Laterality Date   COLONOSCOPY WITH PROPOFOL  N/A 12/19/2016   Procedure: COLONOSCOPY WITH PROPOFOL ;  Surgeon: Gladis MARLA Louder, MD;  Location: [facility];  Service: Endoscopy;  Laterality: N/A;   KNEE ARTHROSCOPY Left    There are no active problems to display for this patient.   PCP: Rexanne Ingle MD  REFERRING PROVIDER: Jerri Kay HERO, MD  REFERRING DIAG: [phone] (ICD-10-CM) - Chronic right shoulder pain M25.562,G89.29 (ICD-10-CM) - Chronic pain of left knee  THERAPY DIAG:  Chronic pain of left knee  Chronic right shoulder pain  Difficulty in walking, not elsewhere classified  Muscle weakness (generalized)  Rationale for Evaluation and Treatment: Rehabilitation  ONSET DATE: Chronic complaints, worsened last 1-2 years  SUBJECTIVE:   SUBJECTIVE STATEMENT: Pt indicated knee Lt being a little achy today, 2-3/10.  Indicated Rt shoulder felt ok after last visit.   PERTINENT HISTORY: See above  PAIN:  NPRS scale: 2-3/10 Pain location: Lt knee Pain description: knees: sharp at times; everyday achy while shoulder is more of an ache Aggravating factors: knee: full bending, WB pressure, going  up stairs  , shoulder: throwing motion, overhead lifting/reaching.  Relieving factors: ibuprofen  PRECAUTIONS: None  WEIGHT BEARING RESTRICTIONS: No  FALLS:  Has patient fallen in last 6 months? No  LIVING ENVIRONMENT: Lives in: House/apartment Stairs: stairs but not to bedroom.   OCCUPATION: Retired   PLOF: Independent, previously used gym until the last few months, yardwork  PATIENT GOALS: Reduce pain  OBJECTIVE:   IMAGING 03/28/2024 xray knee: X-rays of the left knee show advanced tricompartmental osteoarthritis with  significant joint space narrowing and osteophytic changes.   03/28/2024 xray shoulder X-rays of the right shoulder show age-appropriate degenerative changes.   No structural abnormalities.  No acute abnormalities.   PATIENT SURVEYS:  Patient-Specific Activity Scoring Scheme  0 represents "unable to perform." 10 represents "able to perform at prior level. 0 1 2 3 4 5 6 7 8 9  10 (Date and Score)   Activity Eval  04/14/2024  05/12/2024 06/18/2024  1. Stairs with weight  6  9 8   2. Getting off chair  2  7 8   3. Run 6 5 4   4. Lifting things away from body 2 2 2   5.     Score 4 5.75 avg  5.5 avg   Total score = sum of the activity scores/number of activities Minimum detectable change (90%CI) for average score = 2 points Minimum detectable change (90%CI) for single activity score = 3 points  COGNITION: 04/14/2024 Overall  cognitive status: WFL    SENSATION: 04/14/2024 No specific testing today  EDEMA:  04/14/2024 No specific joint edema observed/measured today  MUSCLE LENGTH: 04/14/2024 No specific testing today  POSTURE:  04/14/2024 Rounded shoulders bilateral   PALPATION 04/14/2024 Trigger points in Rt infraspinatus, Rt upper trap  UPPER EXTREMITY ROM:  ROM Right Eval 04/14/2024 Left Eval 04/14/2024 Right 05/27/2024  Shoulder flexion [facility] WFL 150  Shoulder extension     Shoulder abduction [facility]   Shoulder horizontal adduction   30   Shoulder extension     Shoulder internal rotation 62 in 75 deg abduction supine AROM  25 degrees at 70 degrees abduction  Shoulder external rotation 80 in 75 deg abduction supine AROM  90 degrees at 70 degrees abduction   (Blank rows = not tested)   UPPER EXTREMITY MMT:  MMT Right Eval 04/14/2024 Left Eval 04/14/2024 Right 05/12/2024 Left/Right 05/27/2024 assessed in pounds with hand-held dynamometer  Shoulder flexion 5/5 5/5 4+/5   Shoulder extension      Shoulder abduction 5/5 5/5 4+/5   Shoulder adduction      Shoulder extension      Shoulder internal rotation 5/5 5/5  49.6/48.7  Shoulder external rotation 3/5 5/5 3+/5 39.6/9.4   (Blank rows = not tested)   LOWER EXTREMITY ROM:   ROM Right Eval 04/14/2024 Left Eval 04/14/2024  Hip flexion    Hip extension    Hip abduction    Hip adduction    Hip internal rotation    Hip external rotation    Knee flexion 115 c pain 105 c pain  Knee extension    Ankle dorsiflexion    Ankle plantarflexion    Ankle inversion    Ankle eversion     (Blank rows = not tested)  LOWER EXTREMITY MMT:  MMT Right Eval 04/14/2024 Left Eval 04/14/2024 Left 05/02/2024  Hip flexion 5/5 5/5   Knee flexion 5/5 5/5   Knee extension 5/5 82, 84 lbs 4/5 40, 37 lbs  50, 47.4 lbs  Ankle dorsiflexion 5/5 5/5    (Blank rows = not tested)  SPECIAL TESTS:  06/16/2024: (-) Painful arc, empty can Rt shoulder.   04/14/2024 (-) Painful arc, drop arm, empty can, lift off on Rt shoulder.  (+) ER lag noted  FUNCTIONAL TESTS:  04/14/2024 18 inch chair transfer: able on 1st attempt with deviation to Rt leg WB Lt SLS: < 5 seconds Rt SLS: < 5 seconds  GAIT: 04/14/2024 Independent ambulation on level surfaces without noted changes.  TODAY'S TREATMENT     DATE:  06/18/2024 Therex: UBE LE  only seat 18 for knee ROM /endurance 6 mins lvl 4.0 UE UE reverse only lvl 3.0 3 mins  Isometric Rt shoulder ER 5 sec hold x 15   TherActivity (to improve squat, stairs, transfers, ambulation) Leg press double leg 100 lbs slow lowering focus x 15 Leg press single leg 50 lbs 2 x 15 performed bilateral slow lowering focus Rt arm eccentric only lowering in slight scaption 1.5 lbs with Lt arm helping to approx. 120 deg:  performed x15 Lateral step down control 6 inch with blue band TKE in stance  x 12 bilateral     TODAY'S TREATMENT     DATE:  06/16/2024 Therex: UBE LE only, seat 17 for endurance, ROM lvl 4.5 Lt sidelying Rt shoulder ER c towel under arm x20 1 lb weight Lt sidelying Rt shoulder abduction 1 lb  x 15 Lt sidelying Rt shoulder flexion x 10   Review of existing HEP with verbal cues. Discussed LE based HEP training.  Discussed gym return for strengthening.    Neuro Re-ed Standing Rt shoulder ER green band walk out 5 sec hold x 15   TODAY'S TREATMENT     DATE:  05/27/2024 Seated straight leg raises 3 sets of 5 for 3 seconds Seated knee extension machine 25#; 35# and 45# 10 x each 90-40 degrees (to avoid patellofemoral irritation) with slow eccentric emphasis Thumb up the back stretch 10 x 10 seconds  02464: Objective shoulder reassessment for active range of motion and strength; reviewed shoulder anatomy with shoulder model, imaging and compared to this to his objective findings today.  Reviewed knee anatomy, imaging and discussed recommended activities (including a verbal review of his entire home exercise program) with recommendations to schedule additional appointments to fine-tune and simplify his quadriceps strength focused knee exercises and address posterior capsular tightness, scapular and rotator cuff weakness with his right shoulder.  PATIENT EDUCATION:  05/02/2024 Education details: HEP update Person educated: Patient Education method: Programmer, multimedia, Demonstration,  Verbal cues, and Handouts Education comprehension: verbalized understanding, returned demonstration, and verbal cues required  HOME EXERCISE PROGRAM: Access Code: [email or web address] URL: [email or web address] Date: 06/16/2024 Prepared by: Ozell Silvan  Exercises - Seated Scapular Retraction  - 3-5 x daily - 7 x weekly - 1 sets - 10 reps - 3-5 hold - Standing Shoulder Posterior Capsule Stretch (Mirrored)  - 2 x daily - 7 x weekly - 1 sets - 5 reps - 15 hold - Seated Quad Set (Mirrored)  - 3-5 x daily - 7 x weekly - 1 sets - 10 reps - 5 hold - Standing Bilateral Low Shoulder Row with Anchored Resistance  - 1 x daily - 7 x weekly - 2-3 sets - 10-15 reps - Shoulder Extension with Resistance  - 1 x daily - 7 x weekly - 1-2 sets - 10-15 reps - Shoulder External Rotation Reactive Isometrics  - 1 x daily - 7 x weekly - 1 sets - 10 reps - 5-15 hold - Sidelying Shoulder External Rotation (Mirrored)  - 1-2 x daily - 7 x weekly - 2-3 sets - 10-15 reps - Sidelying Shoulder Abduction Palm Forward  - 1 x daily - 7 x weekly - 2-3 sets - 10-15 reps - Sidelying Shoulder Flexion 15 Degrees (Mirrored)  - 1 x daily - 7 x weekly - 2-3 sets - 10-15 reps - Standing Shoulder Internal Rotation Stretch with Hands Behind Back  - 2-3 x daily -  7 x weekly - 1 sets - 10 reps - 10 seconds hold - Seated SLR  - 1-2 x daily - 7 x weekly - 1-2 sets - 10-15 reps - 2 hold  ASSESSMENT:  CLINICAL IMPRESSION: Resumed LE strengthening as able with focus on WB strength control improvement for stairs, squatting, etc.  Inclusions of elevation training eccentric only today to avoid shrug in movement.  May continue to benefit from skilled PT services.    OBJECTIVE IMPAIRMENTS: decreased activity tolerance, decreased balance, decreased coordination, decreased endurance, decreased mobility, difficulty walking, decreased ROM, decreased strength, hypomobility, increased fascial restrictions, impaired perceived functional ability,  increased muscle spasms, impaired flexibility, impaired UE functional use, improper body mechanics, postural dysfunction, and pain.   ACTIVITY LIMITATIONS: carrying, lifting, bending, standing, squatting, sleeping, stairs, transfers, reach over head, and locomotion level  PARTICIPATION LIMITATIONS: meal prep, cleaning, laundry, interpersonal relationship, community activity, yard work, and exercise routine  PERSONAL FACTORS: Time since onset of injury/illness/exacerbation and multiple body parts are also affecting patient's functional outcome.   REHAB POTENTIAL: Good  CLINICAL DECISION MAKING: Evolving/moderate complexity  EVALUATION COMPLEXITY: Moderate   GOALS: Goals reviewed with patient? Yes  SHORT TERM GOALS: (target date for Short term goals are 3 weeks 05/05/2024)   1.  Patient will demonstrate independent use of home exercise program to maintain progress from in clinic treatments. Goal status: Met 05/27/2024  LONG TERM GOALS: (target dates for all long term goals are 10 weeks  06/24/2024 )   1. Patient will demonstrate/report pain at worst less than or equal to 2/10 to facilitate minimal limitation in daily activity secondary to pain symptoms. Goal status: on going 05/27/2024   2. Patient will demonstrate independent use of home exercise program to facilitate ability to maintain/progress functional gains from skilled physical therapy services. Goal status:  on going 05/27/2024   3. Patient will demonstrate Patient specific functional scale avg > or = 8/10 to indicate reduced disability due to condition.   Goal status:  on going 05/05/2024   4.  Patient will demonstrate bilateral  LE MMT 5/5, Lt knee extension dynamometry with 15% of Rt to faciltiate usual transfers, stairs, squatting at PLOF for daily life.   Goal status:  on going 05/05/2024   5.  Patient will demonstrate Lt shoulder MMT 5/5 throughout to facilitate usual reaching, lifting, carrying at PLOF s limitation.  Goal  status:  on going 05/27/2024   6.  Patient will demonstrate ascending/descending stairs reciprocal gait pattern.  Goal status:  on going 05/05/2024   7.  Patient will demonstrate bilateral SLS > 10 seconds to facilitate stability in ambulation.  Goal Status:  on going 05/05/2024   PLAN:  PT FREQUENCY: 1-2x/week  PT DURATION: 4 weeks  PLANNED INTERVENTIONS: Can include 02853- PT Re-evaluation, 97110-Therapeutic exercises, 97530- Therapeutic activity, 97112- Neuromuscular re-education, 97535- Self Care, 97140- Manual therapy, [phone]- Gait training, [phone]- Orthotic Fit/training, [phone]- Canalith repositioning, [id number]- Aquatic Therapy, [phone]- Electrical stimulation (unattended), [id number] Physical performance testing, 97016- Vasopneumatic device, [id number]- Ultrasound, [id number]- Traction (mechanical), [id number]- Ionotophoresis 4mg /ml Dexamethasone,  20560 - Needle insertion w/o injection 1 or 2 muscles, 20561 - Needle insertion w/o injection 3 or more muscles.   Patient/Family education, Balance training, Stair training, Taping, Dry Needling, Joint mobilization, Joint manipulation, Spinal manipulation, Spinal mobilization, Scar mobilization, Vestibular training, Visual/preceptual remediation/compensation, DME instructions, Cryotherapy, and Moist heat.  All performed as medically necessary.  All included unless contraindicated  PLAN FOR NEXT SESSION:  [employer] approved data until 06/24/2024 Continue to improve  WB strength control, functional Rt arm movement control.    Ozell Silvan, PT, DPT, OCS, ATC 06/18/24  3:11 PM      Date of referral: 03/28/2024 Referring provider: Jerri Kay HERO, MD Referring diagnosis? M25.511,G89.29 (ICD-10-CM) - Chronic right shoulder pain M25.562,G89.29 (ICD-10-CM) - Chronic pain of left knee Treatment diagnosis? (if different than referring diagnosis) same  What was this (referring dx) caused by? Ongoing Issue and Arthritis  Lysle of Condition: Chronic (continuous duration > 3  months)   Laterality: Both  Current Functional Measure Score: Patient Specific Functional Scale 4/10 avg today  Objective measurements identify impairments when they are compared to normal values, the uninvolved extremity, and prior level of function.  [x]  Yes  []  No  Objective assessment of functional ability: Moderate functional limitations   Briefly describe symptoms: Pt indicated history of scope on Lt knee years ago with complaints of continued pain with crackling/popping noise.  Does reported some trouble with both but Lt > Rt.  Reported trouble with stairs going up more than down with some giving way.  Reported full bending trouble as well.  Subjective strength gains reported although functional impairments persist.  Rt shoulder indicated to possibly be connected to fall while mowing several years ago.  Reported increased trouble for some time but recovered some but not completely improved.  Limiting in motion and lifting due to Rt shoulder symptoms.  Limited in workout routine.   Pt indicated sleeping improved since starting PT but weakness persists.  How did symptoms start: Insidious onset  Average pain intensity:  Last 24 hours: up to 5/10  Past week: up to 7/10  How often does the pt experience symptoms? Frequently  How much have the symptoms interfered with usual daily activities? Quite a bit  How has condition changed since care began at this facility? Improved sleep, improving strength but recommended additional PT at evaluation and stand by that recommendation at re-cert today to address remaining impairments and long-term goals.  In general, how is the patients overall health? Good   BACK PAIN (STarT Back Screening Tool) No

## 2024-06-19 ENCOUNTER — Encounter: Admitting: Rehabilitative and Restorative Service Providers"

## 2024-06-21 ENCOUNTER — Ambulatory Visit
Admission: RE | Admit: 2024-06-21 | Discharge: 2024-06-21 | Disposition: A | Discharge status: Home / Self Care | Source: Ambulatory Visit | Attending: Orthopaedic Surgery | Admitting: Orthopaedic Surgery

## 2024-06-21 DIAGNOSIS — G8929 Other chronic pain: Secondary | ICD-10-CM

## 2024-06-21 DIAGNOSIS — M75121 Complete rotator cuff tear or rupture of right shoulder, not specified as traumatic: Secondary | ICD-10-CM | POA: Diagnosis not present

## 2024-06-25 ENCOUNTER — Encounter: Payer: Self-pay | Admitting: Rehabilitative and Restorative Service Providers"

## 2024-06-25 ENCOUNTER — Ambulatory Visit: Admitting: Rehabilitative and Restorative Service Providers"

## 2024-06-25 DIAGNOSIS — G8929 Other chronic pain: Secondary | ICD-10-CM | POA: Diagnosis not present

## 2024-06-25 DIAGNOSIS — M25562 Pain in left knee: Secondary | ICD-10-CM | POA: Diagnosis not present

## 2024-06-25 DIAGNOSIS — M6281 Muscle weakness (generalized): Secondary | ICD-10-CM | POA: Diagnosis not present

## 2024-06-25 DIAGNOSIS — M25511 Pain in right shoulder: Secondary | ICD-10-CM | POA: Diagnosis not present

## 2024-06-25 DIAGNOSIS — R262 Difficulty in walking, not elsewhere classified: Secondary | ICD-10-CM | POA: Diagnosis not present

## 2024-06-25 NOTE — Therapy (Addendum)
 OUTPATIENT PHYSICAL THERAPY TREATMENT/ PROGRESS NOTE/ RE-CERT  / DISCHARGE   Patient Name: Jeremy Estrada MRN: [id number] DOB:11-17-1960, 63 y.o., male Today's Date: 06/25/2024   Progress Note Reporting Period 05/27/2024 to 06/25/2024  See note below for Objective Data and Assessment of Progress/Goals.      END OF SESSION:  PT End of Session - 06/25/24 1155     Visit Number 10    Number of Visits 13    Date for PT Re-Evaluation 07/23/24    Authorization Type [employer] $20 copay    Authorization Time Period --    Authorization - Visit Number 1    Authorization - Number of Visits --    Progress Note Due on Visit 13    PT Start Time 1155    PT Stop Time 1235    PT Time Calculation (min) 40 min    Activity Tolerance Patient tolerated treatment well    Behavior During Therapy WFL for tasks assessed/performed                   History reviewed. No pertinent past medical history. Past Surgical History:  Procedure Laterality Date   COLONOSCOPY WITH PROPOFOL  N/A 12/19/2016   Procedure: COLONOSCOPY WITH PROPOFOL ;  Surgeon: Gladis MARLA Louder, MD;  Location: [facility];  Service: Endoscopy;  Laterality: N/A;   KNEE ARTHROSCOPY Left    There are no active problems to display for this patient.   PCP: Rexanne Ingle MD  REFERRING PROVIDER: Jerri Kay HERO, MD  REFERRING DIAG: [phone] (ICD-10-CM) - Chronic right shoulder pain M25.562,G89.29 (ICD-10-CM) - Chronic pain of left knee  THERAPY DIAG:  Chronic pain of left knee  Chronic right shoulder pain  Difficulty in walking, not elsewhere classified  Muscle weakness (generalized)  Rationale for Evaluation and Treatment: Rehabilitation  ONSET DATE: Chronic complaints, worsened last 1-2 years  SUBJECTIVE:   SUBJECTIVE STATEMENT: Patient indicates knee and shoulder being a little achy today. MRI results shows complete tear of supraspinatus and infraspinatus tendons.  PERTINENT HISTORY: See above  PAIN:   NPRS scale: 2/10 Pain location: Lt knee Pain description: knees: sharp at times; everyday achy while shoulder is more of an ache Aggravating factors: knee: full bending, WB pressure, going up stairs  , shoulder: throwing motion, overhead lifting/reaching.  Relieving factors: ibuprofen  PRECAUTIONS: None  WEIGHT BEARING RESTRICTIONS: No  FALLS:  Has patient fallen in last 6 months? No  LIVING ENVIRONMENT: Lives in: House/apartment Stairs: stairs but not to bedroom.   OCCUPATION: Retired   PLOF: Independent, previously used gym until the last few months, yardwork  PATIENT GOALS: Reduce pain  OBJECTIVE:   IMAGING 03/28/2024 xray knee: X-rays of the left knee show advanced tricompartmental osteoarthritis with  significant joint space narrowing and osteophytic changes.   03/28/2024 xray shoulder X-rays of the right shoulder show age-appropriate degenerative changes.   No structural abnormalities.  No acute abnormalities.   PATIENT SURVEYS:  Patient-Specific Activity Scoring Scheme  0 represents "unable to perform." 10 represents "able to perform at prior level. 0 1 2 3 4 5 6 7 8 9  10 (Date and Score)   Activity Eval  04/14/2024  05/12/2024 06/18/2024 06/25/2024  1. Stairs with weight  6  9 8 8   2. Getting off chair  2  7 8 9   3. Run 6 5 4 2   4. Lifting things away from body 2 2 2 4   5.      Score 4 5.75 avg  5.5 avg 5.75  Total score = sum of the activity scores/number of activities Minimum detectable change (90%CI) for average score = 2 points Minimum detectable change (90%CI) for single activity score = 3 points  COGNITION: 04/14/2024 Overall cognitive status: WFL    SENSATION: 04/14/2024 No specific testing today  EDEMA:  04/14/2024 No specific joint edema observed/measured today  MUSCLE LENGTH: 04/14/2024 No specific testing today  POSTURE:  04/14/2024 Rounded shoulders bilateral   PALPATION 04/14/2024 Trigger points in Rt infraspinatus, Rt upper  trap  UPPER EXTREMITY ROM:  ROM Right Eval 04/14/2024 Left Eval 04/14/2024 Right 05/27/2024  Shoulder flexion [facility] WFL 150  Shoulder extension     Shoulder abduction [facility]   Shoulder horizontal adduction   30  Shoulder extension     Shoulder internal rotation 62 in 75 deg abduction supine AROM  25 degrees at 70 degrees abduction  Shoulder external rotation 80 in 75 deg abduction supine AROM  90 degrees at 70 degrees abduction   (Blank rows = not tested)   UPPER EXTREMITY MMT:  MMT Right Eval 04/14/2024 Left Eval 04/14/2024 Right 05/12/2024 Left/Right 05/27/2024 assessed in pounds with hand-held dynamometer Right 06/25/2024  Shoulder flexion 5/5 5/5 4+/5    Shoulder extension       Shoulder abduction 5/5 5/5 4+/5    Shoulder adduction       Shoulder extension       Shoulder internal rotation 5/5 5/5  49.6/48.7   Shoulder external rotation 3/5 5/5 3+/5 39.6/9.4 3+/5   (Blank rows = not tested)   LOWER EXTREMITY ROM:   ROM Right Eval 04/14/2024 Left Eval 04/14/2024  Hip flexion    Hip extension    Hip abduction    Hip adduction    Hip internal rotation    Hip external rotation    Knee flexion 115 c pain 105 c pain  Knee extension    Ankle dorsiflexion    Ankle plantarflexion    Ankle inversion    Ankle eversion     (Blank rows = not tested)  LOWER EXTREMITY MMT:  MMT Right Eval 04/14/2024 Left Eval 04/14/2024 Left 05/02/2024 Left 06/25/2024  Hip flexion 5/5 5/5    Knee flexion 5/5 5/5    Knee extension 5/5 82, 84 lbs 4/5 40, 37 lbs  50, 47.4 lbs 5/5  47.5, 46 lbs  Ankle dorsiflexion 5/5 5/5     (Blank rows = not tested)  SPECIAL TESTS:  06/16/2024: (-) Painful arc, empty can Rt shoulder.   04/14/2024 (-) Painful arc, drop arm, empty can, lift off on Rt shoulder.  (+) ER lag noted  FUNCTIONAL TESTS:  04/14/2024 18 inch chair transfer: able on 1st attempt with deviation to Rt leg WB Lt SLS: < 5 seconds Rt SLS: < 5  seconds  GAIT: 04/14/2024 Independent ambulation on level surfaces without noted changes.  TODAY'S TREATMENT     DATE:  06/25/2024 Therex: SciFit LEs only, seat 18, level 4 for 6 minutes  SciFit UEs only, seat 18, level 4 for 4 minutes PT verbally educated patient about which gym equipment to use and which to stay away from for strength gains within his available ROM and with symptoms into consideration  TherAct: Double leg press 112#, 1x10. For facilitation of STS Double leg press 125#, 1x10. For facilitation of STS Single leg press 50#, 1x10. For facilitation of STS Active shoulder flexion and scaption with 1# hand weight. For facilitation of lifting   Self-Care PT verbally educated patient about what a complete tear in his rotator cuff means. Patient verbalized understanding PT verbally educated patient about potential surgery options that are available to him and what recovery may look like. Patient verbalized understanding   TODAY'S TREATMENT     DATE:  06/18/2024 Therex: UBE LE only seat 18 for knee ROM /endurance 6 mins lvl 4.0 UE UE reverse only lvl 3.0 3 mins  Isometric Rt shoulder ER 5 sec hold x 15   TherActivity (to improve squat, stairs, transfers, ambulation) Leg press double leg 100 lbs slow lowering focus x 15 Leg press single leg 50 lbs 2 x 15 performed bilateral slow lowering focus Rt arm eccentric only lowering in slight scaption 1.5 lbs with Lt arm helping to approx. 120 deg:  performed x15 Lateral step down control 6 inch with blue band TKE in stance  x 12 bilateral     TODAY'S TREATMENT     DATE:  06/16/2024 Therex: UBE LE only, seat 17 for endurance, ROM lvl 4.5 Lt sidelying Rt shoulder ER c towel under arm x20 1 lb weight Lt sidelying Rt shoulder abduction 1 lb  x 15 Lt sidelying Rt  shoulder flexion x 10   Review of existing HEP with verbal cues. Discussed LE based HEP training.  Discussed gym return for strengthening.    Neuro Re-ed Standing Rt shoulder ER green band walk out 5 sec hold x 15   PATIENT EDUCATION:  05/02/2024 Education details: HEP update Person educated: Patient Education method: Programmer, Multimedia, Demonstration, Verbal cues, and Handouts Education comprehension: verbalized understanding, returned demonstration, and verbal cues required  HOME EXERCISE PROGRAM: Access Code: [email or web address] URL: [email or web address] Date: 06/16/2024 Prepared by: Ozell Silvan  Exercises - Seated Scapular Retraction  - 3-5 x daily - 7 x weekly - 1 sets - 10 reps - 3-5 hold - Standing Shoulder Posterior Capsule Stretch (Mirrored)  - 2 x daily - 7 x weekly - 1 sets - 5 reps - 15 hold - Seated Quad Set (Mirrored)  - 3-5 x daily - 7 x weekly - 1 sets - 10 reps - 5 hold - Standing Bilateral Low Shoulder Row with Anchored Resistance  - 1 x daily - 7 x weekly - 2-3 sets - 10-15 reps - Shoulder Extension with Resistance  - 1 x daily - 7 x weekly - 1-2 sets - 10-15 reps - Shoulder External Rotation Reactive Isometrics  - 1 x daily - 7 x weekly - 1 sets - 10 reps - 5-15 hold - Sidelying Shoulder External Rotation (Mirrored)  - 1-2 x daily - 7 x weekly - 2-3 sets - 10-15 reps - Sidelying Shoulder Abduction Palm Forward  - 1 x daily - 7 x weekly - 2-3 sets - 10-15 reps - Sidelying Shoulder Flexion 15 Degrees (Mirrored)  - 1 x daily - 7 x weekly - 2-3 sets - 10-15 reps -  Standing Shoulder Internal Rotation Stretch with Hands Behind Back  - 2-3 x daily - 7 x weekly - 1 sets - 10 reps - 10 seconds hold - Seated SLR  - 1-2 x daily - 7 x weekly - 1-2 sets - 10-15 reps - 2 hold  ASSESSMENT:  CLINICAL IMPRESSION: Patient is progressing fair overall.  Patient met most of his goals except for overall strength goals for his LE and UE.  Patient understands that weakness from his  shoulder is limited due to the tears in his rotator cuff (see MRI recently)  Patient was advised on how to strengthen with caution and with minimal spikes of pain.  Additionally, patient has not met some of his higher level functioning goals as seen by lower average in PSAS.  Patient will benefit from a few extra PT sessions to solidify a maintenance program that he can use at home and/or the gym for optimal function and minimal pain.  Pt will return to MD prior to next visit and will return for discussion about MRI.   OBJECTIVE IMPAIRMENTS: decreased activity tolerance, decreased balance, decreased coordination, decreased endurance, decreased mobility, difficulty walking, decreased ROM, decreased strength, hypomobility, increased fascial restrictions, impaired perceived functional ability, increased muscle spasms, impaired flexibility, impaired UE functional use, improper body mechanics, postural dysfunction, and pain.   ACTIVITY LIMITATIONS: carrying, lifting, bending, standing, squatting, sleeping, stairs, transfers, reach over head, and locomotion level  PARTICIPATION LIMITATIONS: meal prep, cleaning, laundry, interpersonal relationship, community activity, yard work, and exercise routine  PERSONAL FACTORS: Time since onset of injury/illness/exacerbation and multiple body parts are also affecting patient's functional outcome.   REHAB POTENTIAL: Good  CLINICAL DECISION MAKING: Evolving/moderate complexity  EVALUATION COMPLEXITY: Moderate   GOALS: Goals reviewed with patient? Yes  SHORT TERM GOALS: (target date for Short term goals are 3 weeks 05/05/2024)   1.  Patient will demonstrate independent use of home exercise program to maintain progress from in clinic treatments. Goal status: Met 05/27/2024  LONG TERM GOALS: (target dates for all long term goals are 10 weeks  07/23/2024 )   1. Patient will demonstrate/report pain at worst less than or equal to 2/10 to facilitate minimal limitation  in daily activity secondary to pain symptoms. Goal status: Met 06/25/2024   2. Patient will demonstrate independent use of home exercise program to facilitate ability to maintain/progress functional gains from skilled physical therapy services. Goal status:  Met 06/25/2024   3. Patient will demonstrate Patient specific functional scale avg > or = 8/10 to indicate reduced disability due to condition.  Goal status:  revised date 06/25/2024   4.  Patient will demonstrate bilateral  LE MMT 5/5, Lt knee extension dynamometry with 15% of Rt to faciltiate usual transfers, stairs, squatting at PLOF for daily life.  Goal status: revised date 06/25/2024   5.  Patient will demonstrate Lt shoulder MMT 5/5 throughout to facilitate usual reaching, lifting, carrying at PLOF s limitation.  Goal status: revised date 06/25/2024   6.  Patient will demonstrate ascending/descending stairs reciprocal gait pattern.  Goal status:  Met 06/25/2024   7.  Patient will demonstrate bilateral SLS > 10 seconds to facilitate stability in ambulation.  Goal Status:  Met 06/25/2024   PLAN:  PT FREQUENCY: 1x/week  PT DURATION: 4 weeks  PLANNED INTERVENTIONS: Can include 02853- PT Re-evaluation, 97110-Therapeutic exercises, 97530- Therapeutic activity, [id number]- Neuromuscular re-education, 97535- Self Care, 97140- Manual therapy, [phone]- Gait training, [id number]- Orthotic Fit/training, [phone]- Canalith repositioning, [id number]- Aquatic Therapy, [phone]- Electrical stimulation (  unattended), 97750 Physical performance testing, 02983- Vasopneumatic device, [id number]- Ultrasound, [id number]- Traction (mechanical), [id number]- Ionotophoresis 4mg /ml Dexamethasone,  20560 - Needle insertion w/o injection 1 or 2 muscles, 20561 - Needle insertion w/o injection 3 or more muscles.   Patient/Family education, Balance training, Stair training, Taping, Dry Needling, Joint mobilization, Joint manipulation, Spinal manipulation, Spinal mobilization, Scar mobilization, Vestibular  training, Visual/preceptual remediation/compensation, DME instructions, Cryotherapy, and Moist heat.  All performed as medically necessary.  All included unless contraindicated  PLAN FOR NEXT SESSION:  work on remaining goals, updates from MD appointment    Ismael Theophilus Stallion, SPT 06/25/24  2:31 PM   PHYSICAL THERAPY DISCHARGE SUMMARY  Visits from Start of Care: 10  Current functional level related to goals / functional outcomes: See note   Remaining deficits: See note   Education / Equipment: HEP  Patient goals were partially met. Patient is being discharged due to not returning since the last visit.  Ozell Silvan, PT, DPT, OCS, ATC 08/29/24  8:20 AM      Date of referral: 03/28/2024 Referring provider: Jerri Kay HERO, MD Referring diagnosis? M25.511,G89.29 (ICD-10-CM) - Chronic right shoulder pain M25.562,G89.29 (ICD-10-CM) - Chronic pain of left knee Treatment diagnosis? (if different than referring diagnosis) same  What was this (referring dx) caused by? Ongoing Issue and Arthritis  Lysle of Condition: Chronic (continuous duration > 3 months)   Laterality: Both  Current Functional Measure Score: Patient Specific Functional Scale 5.75 avg today  Objective measurements identify impairments when they are compared to normal values, the uninvolved extremity, and prior level of function.  [x]  Yes  []  No  Objective assessment of functional ability: Moderate functional limitations   Briefly describe symptoms:  06/25/2024: Patient has left knee pain and right shoulder pain.  Patient has difficulty reaching overhead, flexion, and scaption with weights.  Patient has weakness in his right upper extremity and fatigues quickly with strengthening exercises.  Due to these, patient is unable to do functional activities such as throwing a ball or lifting.  Patient reported pain with functional activities such as running.  Objective measurements show unequal strength  measurements between the left and right LE causing pain and overall weakness.  Patient reports feelings of instability in his right knee due to possible overuse of his left LE.  Eval: Pt indicated history of scope on Lt knee years ago with complaints of continued pain with crackling/popping noise.  Does reported some trouble with both but Lt > Rt.  Reported trouble with stairs going up more than down with some giving way.  Reported full bending trouble as well.  Subjective strength gains reported although functional impairments persist.  Rt shoulder indicated to possibly be connected to fall while mowing several years ago.  Reported increased trouble for some time but recovered some but not completely improved.  Limiting in motion and lifting due to Rt shoulder symptoms.  Limited in workout routine.   Pt indicated sleeping improved since starting PT but weakness persists.  How did symptoms start: Insidious onset  Average pain intensity:  Last 24 hours: up to 2/10  Past week: up to 4/10  How often does the pt experience symptoms? Frequently  How much have the symptoms interfered with usual daily activities? Moderately  How has condition changed since care began at this facility? Improved sleep, improving strength but recommended additional PT at evaluation and stand by that recommendation at re-cert today to address remaining impairments and long-term goals.  In general, how is the patients overall health?  Good   BACK PAIN (STarT Back Screening Tool) No

## 2024-06-27 ENCOUNTER — Ambulatory Visit: Admitting: Orthopaedic Surgery

## 2024-06-27 ENCOUNTER — Other Ambulatory Visit (HOSPITAL_COMMUNITY): Payer: Self-pay

## 2024-06-27 ENCOUNTER — Encounter: Admitting: Rehabilitative and Restorative Service Providers"

## 2024-06-27 ENCOUNTER — Other Ambulatory Visit: Payer: Self-pay

## 2024-06-27 DIAGNOSIS — M1711 Unilateral primary osteoarthritis, right knee: Secondary | ICD-10-CM

## 2024-07-08 ENCOUNTER — Ambulatory Visit (INDEPENDENT_AMBULATORY_CARE_PROVIDER_SITE_OTHER): Admitting: Orthopaedic Surgery

## 2024-07-08 DIAGNOSIS — M12811 Other specific arthropathies, not elsewhere classified, right shoulder: Secondary | ICD-10-CM | POA: Diagnosis not present

## 2024-07-08 DIAGNOSIS — M1711 Unilateral primary osteoarthritis, right knee: Secondary | ICD-10-CM

## 2024-07-08 DIAGNOSIS — M1712 Unilateral primary osteoarthritis, left knee: Secondary | ICD-10-CM | POA: Diagnosis not present

## 2024-07-08 MED ORDER — SODIUM HYALURONATE 60 MG/3ML IX PRSY
60.0000 mg | PREFILLED_SYRINGE | INTRA_ARTICULAR | Status: AC | PRN
  Administered 2024-07-08: 60 mg via INTRA_ARTICULAR

## 2024-07-08 NOTE — Progress Notes (Signed)
   Office Visit Note   Patient: Jeremy Estrada           Date of Birth: 06/23/1961           MRN: [id number] Visit Date: 07/08/2024              Requested by: Rexanne Ingle, MD [address] [place],  [place] [address] PCP: Rexanne Ingle, MD   Assessment & Plan: Visit Diagnoses:  1. Primary osteoarthritis of left knee   2. Rotator cuff arthropathy of right shoulder     Plan: History of Present Illness Jeremy Estrada is a 63 year old male who presents for MRI review and gel injection.  He has massive tears in the supraspinatus and infraspinatus, which are fully torn and retracted, measuring six centimeters. Muscle atrophy is present. He experienced shoulder pain after a fall a few years ago, but the pain subsided, delaying medical attention.  He is concerned about the potential worsening of his shoulder condition if left untreated and the progression of arthritis and pain.  Physical Exam MUSCULOSKELETAL: Aspiration of 70 cc fluid from the knee.  Results RADIOLOGY Shoulder MRI: Massive tears in supraspinatus and infraspinatus with complete retraction and muscle atrophy, indicating chronic condition.  PROCEDURE NOTES Procedure: Joint Aspiration (07/08/2024) Description: Aspiration of 70 cc of fluid from the knee.  Assessment and Plan Chronic right knee osteoarthritis with effusion Chronic osteoarthritis with significant effusion managed with gel injections. Condition may progress to require knee replacement. - Perform aspiration of right knee effusion. - Administer gel injection to right knee.  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 Reverse shoulder replacement is the only surgical option. - Consider referral to Dr. Addie for discussion on reverse shoulder replacement surgery.  Left knee osteoarthritis - Will get [employer] approval.  This patient is diagnosed with  osteoarthritis of the knee(s).    Radiographs show evidence of joint space narrowing, osteophytes, subchondral sclerosis and/or subchondral cysts.  This patient has knee pain which interferes with functional and activities of daily living.    This patient has experienced inadequate response, adverse effects and/or intolerance with conservative treatments such as acetaminophen, NSAIDS, topical creams, physical therapy or regular exercise, knee bracing and/or weight loss.   This patient has experienced inadequate response or has a contraindication to intra articular steroid injections for at least 3 months.   This patient is not scheduled to have a total knee replacement within 6 months of starting treatment with viscosupplementation.   Follow-Up Instructions: No follow-ups on file.   Orders:  Orders Placed This Encounter  Procedures   Ambulatory request for injection medication   No orders of the defined types were placed in this encounter.     Procedures: Large Joint Inj: R knee on 07/08/2024 12:15 PM Indications: pain Details: 22 G needle  Arthrogram: No  Medications: 60 mg Sodium Hyaluronate 60 MG/3ML Outcome: tolerated well, no immediate complications Patient was prepped and draped in the usual sterile fashion.       Clinical Data: No additional findings.   Subjective: Chief Complaint  Patient presents with   Right Shoulder - Follow-up   Right Knee - Pain, Injections    DUROLANE INJ

## 2024-07-24 ENCOUNTER — Ambulatory Visit (INDEPENDENT_AMBULATORY_CARE_PROVIDER_SITE_OTHER): Admitting: Physician Assistant

## 2024-07-24 DIAGNOSIS — M1712 Unilateral primary osteoarthritis, left knee: Secondary | ICD-10-CM

## 2024-07-24 MED ORDER — SODIUM HYALURONATE 60 MG/3ML IX PRSY
60.0000 mg | PREFILLED_SYRINGE | INTRA_ARTICULAR | Status: AC | PRN
  Administered 2024-07-24: 60 mg via INTRA_ARTICULAR

## 2024-07-24 MED ORDER — LIDOCAINE HCL 1 % IJ SOLN
5.0000 mL | INTRAMUSCULAR | Status: AC | PRN
  Administered 2024-07-24: 5 mL

## 2024-07-24 MED ORDER — BUPIVACAINE HCL 0.25 % IJ SOLN
5.0000 mL | INTRAMUSCULAR | Status: AC | PRN
  Administered 2024-07-24: 5 mL via INTRA_ARTICULAR

## 2024-07-24 NOTE — Progress Notes (Signed)
   Procedure Note  Patient: Jeremy Estrada             Date of Birth: Nov 03, 1960           MRN: [id number]             Visit Date: 07/24/2024  Procedures: Visit Diagnoses: No diagnosis found.  Large Joint Inj: L knee on 07/24/2024 1:29 PM Indications: pain Details: 22 G needle, anterolateral approach Medications: 5 mL lidocaine  1 %; 5 mL bupivacaine  0.25 %; 60 mg Sodium Hyaluronate 60 MG/3ML

## 2024-07-25 ENCOUNTER — Other Ambulatory Visit (HOSPITAL_COMMUNITY): Payer: Self-pay

## 2024-07-25 ENCOUNTER — Other Ambulatory Visit: Payer: Self-pay

## 2024-07-25 MED ORDER — METFORMIN HCL ER 500 MG PO TB24
500.0000 mg | ORAL_TABLET | Freq: Two times a day (BID) | ORAL | 3 refills | Status: AC
  Filled 2024-07-25: qty 180, 90d supply, fill #0
  Filled 2024-11-11: qty 180, 90d supply, fill #1

## 2024-09-23 ENCOUNTER — Ambulatory Visit: Admitting: Orthopaedic Surgery

## 2024-09-23 DIAGNOSIS — M1711 Unilateral primary osteoarthritis, right knee: Secondary | ICD-10-CM | POA: Diagnosis not present

## 2024-09-23 MED ORDER — BUPIVACAINE HCL 0.5 % IJ SOLN
2.0000 mL | INTRAMUSCULAR | Status: AC | PRN
  Administered 2024-09-23: 2 mL via INTRA_ARTICULAR

## 2024-09-23 MED ORDER — METHYLPREDNISOLONE ACETATE 40 MG/ML IJ SUSP
40.0000 mg | INTRAMUSCULAR | Status: AC | PRN
  Administered 2024-09-23: 40 mg via INTRA_ARTICULAR

## 2024-09-23 MED ORDER — LIDOCAINE HCL 1 % IJ SOLN
2.0000 mL | INTRAMUSCULAR | Status: AC | PRN
  Administered 2024-09-23: 2 mL

## 2024-09-23 NOTE — Progress Notes (Signed)
 Office Visit Note   Patient: Jeremy Estrada           Date of Birth: [birth year]           MRN: [id number] Visit Date: 09/23/2024              Requested by: Rexanne Ingle, MD [address] [place],  [place] [address] PCP: Rexanne Ingle, MD   Assessment & Plan: Visit Diagnoses:  1. Primary osteoarthritis of right knee     Plan: History of Present Illness Jeremy Estrada is a 63 year old male who presents with right knee swelling.  He has recurrent, persistent swelling in the right knee, worse than the left. He previously received a gel injection in the right knee but no cortisone.  The left knee is weaker than the right. He can lift more than twice as much weight with the right knee compared to the left. He has had prior left knee x-rays and both cortisone and gel injections.  He takes metformin  for prediabetes.  Physical Exam MUSCULOSKELETAL: Right knee swollen. 20 cc fluid aspirated from right knee and cortisone injected.  Procedure: Arthrocentesis with Corticosteroid Injection Description: Aspiration of 20 cc of synovial fluid from the right knee. Corticosteroid injected into the joint.  Assessment and Plan Primary osteoarthritis of right knee with effusion Chronic osteoarthritis with recurrent effusion. Current effusion less severe than previous episodes. - Aspirated 20 cc of fluid from right knee. - Administered cortisone injection to right knee.  Follow-Up Instructions: No follow-ups on file.   Orders:  Orders Placed This Encounter  Procedures   Large Joint Inj   No orders of the defined types were placed in this encounter.     Procedures: Large Joint Inj: R knee on 09/23/2024 11:57 AM Indications: pain Details: 22 G needle  Arthrogram: No  Medications: 40 mg methylPREDNISolone  acetate 40 MG/ML; 2 mL lidocaine  1 %; 2 mL bupivacaine  0.5 % Consent was given by the patient. Patient was prepped and draped in the usual sterile fashion.       Clinical  Data: No additional findings.   Subjective: Chief Complaint  Patient presents with   Right Knee - Pain    HPI  Review of Systems  Constitutional: Negative.   HENT: Negative.    Eyes: Negative.   Respiratory: Negative.    Cardiovascular: Negative.   Gastrointestinal: Negative.   Endocrine: Negative.   Genitourinary: Negative.   Skin: Negative.   Allergic/Immunologic: Negative.   Neurological: Negative.   Hematological: Negative.   Psychiatric/Behavioral: Negative.    All other systems reviewed and are negative.    Objective: Vital Signs: There were no vitals taken for this visit.  Physical Exam Vitals and nursing note reviewed.  Constitutional:      Appearance: He is well-developed.  HENT:     Head: Normocephalic and atraumatic.  Eyes:     Pupils: Pupils are equal, round, and reactive to light.  Pulmonary:     Effort: Pulmonary effort is normal.  Abdominal:     Palpations: Abdomen is soft.  Musculoskeletal:        General: Normal range of motion.     Cervical back: Neck supple.  Skin:    General: Skin is warm.  Neurological:     Mental Status: He is alert and oriented to person, place, and time.  Psychiatric:        Behavior: Behavior normal.        Thought Content: Thought content normal.  Judgment: Judgment normal.     Ortho Exam  Specialty Comments:  No specialty comments available.  Imaging: No results found.   PMFS History: Patient Active Problem List   Diagnosis Date Noted   Primary osteoarthritis of left knee 07/08/2024   Rotator cuff arthropathy of right shoulder 07/08/2024   No past medical history on file.  No family history on file.  Past Surgical History:  Procedure Laterality Date   COLONOSCOPY WITH PROPOFOL  N/A 12/19/2016   Procedure: COLONOSCOPY WITH PROPOFOL ;  Surgeon: Gladis MARLA Louder, MD;  Location: [facility];  Service: Endoscopy;  Laterality: N/A;   KNEE ARTHROSCOPY Left    Social History   Occupational  History   Not on file  Tobacco Use   Smoking status: Never   Smokeless tobacco: Never  Substance and Sexual Activity   Alcohol use: Not on file   Drug use: Not on file   Sexual activity: Not on file

## 2024-09-29 ENCOUNTER — Other Ambulatory Visit (HOSPITAL_COMMUNITY): Payer: Self-pay

## 2024-09-30 ENCOUNTER — Other Ambulatory Visit (HOSPITAL_COMMUNITY): Payer: Self-pay

## 2024-09-30 MED ORDER — SILDENAFIL CITRATE 100 MG PO TABS
100.0000 mg | ORAL_TABLET | Freq: Every day | ORAL | 6 refills | Status: AC | PRN
  Filled 2024-09-30: qty 10, 10d supply, fill #0
  Filled 2024-11-12: qty 10, 10d supply, fill #1

## 2024-10-27 ENCOUNTER — Other Ambulatory Visit: Payer: Self-pay | Admitting: Internal Medicine

## 2024-10-27 DIAGNOSIS — I77810 Thoracic aortic ectasia: Secondary | ICD-10-CM

## 2024-10-28 ENCOUNTER — Other Ambulatory Visit (HOSPITAL_COMMUNITY): Payer: Self-pay

## 2024-10-28 MED ORDER — TRUE METRIX AIR GLUCOSE METER W/DEVICE KIT
PACK | 0 refills | Status: AC
  Filled 2024-10-28: qty 1, 30d supply, fill #0

## 2024-10-28 MED ORDER — TRUE METRIX BLOOD GLUCOSE TEST VI STRP
ORAL_STRIP | 3 refills | Status: AC
  Filled 2024-10-28: qty 100, 90d supply, fill #0

## 2024-10-28 MED ORDER — ACCU-CHEK SOFTCLIX LANCETS MISC
3 refills | Status: AC
  Filled 2024-10-28: qty 100, 90d supply, fill #0

## 2024-10-29 ENCOUNTER — Other Ambulatory Visit (HOSPITAL_COMMUNITY): Payer: Self-pay

## 2024-10-29 MED ORDER — AMLODIPINE BESYLATE 10 MG PO TABS
10.0000 mg | ORAL_TABLET | Freq: Every day | ORAL | 3 refills | Status: AC
  Filled 2024-10-29: qty 90, 90d supply, fill #0

## 2024-10-29 MED ORDER — LOSARTAN POTASSIUM 100 MG PO TABS
100.0000 mg | ORAL_TABLET | Freq: Every day | ORAL | 3 refills | Status: AC
  Filled 2024-10-29: qty 90, 90d supply, fill #0

## 2024-10-29 MED ORDER — HYDROCHLOROTHIAZIDE 25 MG PO TABS
25.0000 mg | ORAL_TABLET | Freq: Every morning | ORAL | 3 refills | Status: AC
  Filled 2024-10-29: qty 90, 90d supply, fill #0

## 2024-11-04 ENCOUNTER — Ambulatory Visit
Admission: RE | Admit: 2024-11-04 | Discharge: 2024-11-04 | Disposition: A | Source: Ambulatory Visit | Attending: Internal Medicine | Admitting: Internal Medicine

## 2024-11-04 DIAGNOSIS — I77810 Thoracic aortic ectasia: Secondary | ICD-10-CM

## 2024-11-04 MED ORDER — IOPAMIDOL (ISOVUE-370) INJECTION 76%
100.0000 mL | Freq: Once | INTRAVENOUS | Status: AC | PRN
  Administered 2024-11-04: 100 mL via INTRAVENOUS

## 2024-11-05 ENCOUNTER — Other Ambulatory Visit: Payer: Self-pay | Admitting: Internal Medicine

## 2024-11-05 DIAGNOSIS — Q453 Other congenital malformations of pancreas and pancreatic duct: Secondary | ICD-10-CM

## 2024-11-12 ENCOUNTER — Other Ambulatory Visit (HOSPITAL_COMMUNITY): Payer: Self-pay

## 2024-11-13 ENCOUNTER — Other Ambulatory Visit: Payer: Self-pay

## 2024-11-13 ENCOUNTER — Other Ambulatory Visit (HOSPITAL_COMMUNITY): Payer: Self-pay

## 2024-12-03 ENCOUNTER — Other Ambulatory Visit
